# Patient Record
Sex: Female | Born: 1975
Health system: Southern US, Community
[De-identification: ages and names within clinical notes are randomized; demographics above are authoritative.]

## PROBLEM LIST (undated history)

## (undated) DIAGNOSIS — F419 Anxiety disorder, unspecified: Secondary | ICD-10-CM

## (undated) DIAGNOSIS — D649 Anemia, unspecified: Secondary | ICD-10-CM

## (undated) DIAGNOSIS — I10 Essential (primary) hypertension: Secondary | ICD-10-CM

## (undated) HISTORY — DX: Essential (primary) hypertension: I10

---

## 2006-06-23 ENCOUNTER — Ambulatory Visit: Payer: Self-pay | Admitting: Obstetrics & Gynecology

## 2007-06-26 ENCOUNTER — Encounter (INDEPENDENT_AMBULATORY_CARE_PROVIDER_SITE_OTHER): Payer: Self-pay | Admitting: Gynecology

## 2007-06-26 ENCOUNTER — Ambulatory Visit: Payer: Self-pay | Admitting: Gynecology

## 2007-10-25 ENCOUNTER — Ambulatory Visit: Payer: Self-pay | Admitting: Internal Medicine

## 2008-05-02 ENCOUNTER — Ambulatory Visit: Payer: Self-pay | Admitting: Internal Medicine

## 2008-07-03 ENCOUNTER — Encounter (INDEPENDENT_AMBULATORY_CARE_PROVIDER_SITE_OTHER): Payer: Self-pay | Admitting: Gynecology

## 2008-07-03 ENCOUNTER — Ambulatory Visit: Payer: Self-pay | Admitting: Gynecology

## 2011-04-20 NOTE — Assessment & Plan Note (Signed)
Brooke Jones, Brooke Jones                  ACCOUNT NO.:  [id number]   MEDICAL RECORD NO.:  [id number]          PATIENT TYPE:  POB   LOCATION:  [facility] at [facility]         FACILITY:  [facility]   PHYSICIAN:  Ginger Carne, MD DATE OF BIRTH:  1976-02-03   DATE OF SERVICE:  07/03/2008                                  CLINIC NOTE   Brooke Jones returns today for routine gynecological evaluation.  She is a  G1, P 1-0-0-1 who takes the generic form of Yasmin and desires routine  gynecologic exam today.  She has stress related to her job and has  difficulty sleeping, has asked for mild sleeping medication.  Otherwise,  she has no specific GI, GU, cardiac, or GYN complaints.  She had one  episode of postcoital bleeding, but this has not recurred.   PHYSICAL EXAMINATION:  VITALS SIGNS:  Blood pressure 128/95, weight 199  pounds, height 5 feet 5 inches, pulse 87 and regular.  HEENT:  Grossly normal.  BREAST:  Without masses, discharge, thickenings, or tenderness.  CHEST:  Clear to percussion and auscultation.  CARDIOVASCULAR:  Without murmurs or enlargements.  Regular rate and  rhythm.  EXTREMITY:  Within normal limits.  LYMPHATIC:  Within normal limits.  SKIN:  Within normal limits.  NEUROLOGICAL:  Within normal limits.  MUSCULOSKELETAL:  Within normal limits.  ABDOMEN:  Soft without gross hepatosplenomegaly.  PELVIC:  External genitalia, vulva and vagina normal.  Cervix smooth  without erosions or lesions.  Pap performed.  Uterus small, anteverted,  and flexed.  Both adnexa palpable and found to be normal.   IMPRESSION:  Normal gynecologic exam.   PLAN:  The patient was prescribed Yasmin for 1 year.  Also, given Ambien  5 mg to take 1 at night.  She was told if this does not help with sleep,  she could try 2 at night and let us know how she is doing.  I asked her  to keep track of her postcoital bleeding.           ______________________________  Ginger Carne, MD     SHB/MEDQ  D:   07/03/2008  T:  07/03/2008  Job:  [id number]

## 2011-04-20 NOTE — Assessment & Plan Note (Signed)
NAMESHERRELLE, PROCHAZKA NO.:  [id number]   MEDICAL RECORD NO.:  [id number]          PATIENT TYPE:  POB   LOCATION:  [facility]         FACILITY:  [facility]   PHYSICIAN:  Ginger Carne, MD DATE OF BIRTH:  [birth year]   DATE OF SERVICE:                                  CLINIC NOTE   ADDENDUM   This is an addendum note to physical examination number [phone],  dictation on July 03, 2008.  The patient's blood pressure was 128/95 at  this visit.  Past evaluations revealed 129/97 in July 2008.  The patient  was asked to return in 3-4 weeks if it is still elevated.  The patient  will be referred to primary care physician for management of her blood  pressure.           ______________________________  Ginger Carne, MD     SHB/MEDQ  D:  07/03/2008  T:  07/03/2008  Job:  [id number]

## 2011-04-23 NOTE — Group Therapy Note (Signed)
NAMEZOHAR, LAING NO.:  [id number]   MEDICAL RECORD NO.:  [id number]          PATIENT TYPE:  POB   LOCATION:  [facility]                   FACILITY:  [facility]   PHYSICIAN:  Elsie Lincoln, MD      DATE OF BIRTH:  November 08, 1976   DATE OF SERVICE:                                    CLINIC NOTE   OFFICE VISIT [facility]   DATE OF VISIT:  June 23, 2006   SUBJECTIVE:  This is a 35 year old para 1 female with last menstrual period  June 14, 2006 who presents for her yearly physical.  She is divorced and  sexually active with no complaints.  She is on Ovcon 50 and would like to  switch to a lower estrogen pill.  She was on Triphasil many years ago but  her ex-husband thought that she was moody and they switched it to a higher  estrogen pill.  However, now with the increased risk of DVT with a high  estrogen pill and she is no longer married to the man who thought she was  moody, going back to a 30 mcg pill would be more suitable.  We will try  Yasmin as this will also decrease her risk of DVT.  The patient had one  irregular cycle last month while on antibiotics.  She used a back-up method  during this time frame.  She has no other complaints.  She is sexually  active with no dyspareunia.  There is no leakage of urine.   PAST MEDICAL HISTORY:  She denies all medical problems.   PAST SURGICAL HISTORY:  She denies all surgeries.   GYN HISTORY:  An SVD x1.  She has a 34-year-old child.  No abnormal Pap  smears.  No GYN problems.   ALLERGIES:  PENICILLIN.   MEDICATIONS:  Ovcon which has now been stopped.   FAMILY HISTORY:  Father has had a heart attack at age 47, borderline  diabetes.  No ovarian cancer, breast cancer or colon cancer.   REVIEW OF SYSTEMS:  Negative.   PHYSICAL EXAMINATION:  VITAL SIGNS:  Pulse 98.  Blood pressure 139/92.  Weight 201.  Height 5 feet 5 inches.  GENERAL APPEARANCE:  Well-nourished, well-developed, in no apparent  distress.  HEENT:  Normocephalic, atraumatic.  Good dentition.  NECK:  Thyroid with no masses.  LUNGS:  Clear to auscultation bilaterally.  HEART:  Regular rate and rhythm with a mild systolic ejection murmur at the  left sternal border.  ABDOMEN:  Soft, nontender, slightly obese.  No hernias.  BREASTS:  No masses, nontender, no lymphadenopathy.  No skin changes.  PELVIC:  Genitalia Tanner V.  Urethra:  No prolapse, nontender. No  cystocele.  Vagina pink, normal rugae.  Cervix: No prolapse, nontender.  Uterus nontender.  Adnexa with no masses, nontender.  No rectocele.  Perineum intact.  EXTREMITIES:  Nontender, no edema.   ASSESSMENT:  Patient is a 35 year old well female.   PLAN:  1.  Switch from Ovcon 50 to Yasmin.  2.  Fasting lipids and basic metabolic panel.  3.  Return to  clinic in one year.           ______________________________  Elsie Lincoln, MD     KL/MEDQ  D:  06/23/2006  T:  06/23/2006  Job:  [id number]

## 2012-11-12 ENCOUNTER — Ambulatory Visit: Payer: Self-pay | Admitting: Family Medicine

## 2012-11-12 LAB — CBC WITH DIFFERENTIAL/PLATELET
Basophil %: 1.2 %
Eosinophil %: 1.1 %
HCT: 36.3 % (ref 35.0–47.0)
HGB: 11.8 g/dL — ABNORMAL LOW (ref 12.0–16.0)
Lymphocyte %: 26.5 %
MCH: 24.4 pg — ABNORMAL LOW (ref 26.0–34.0)
MCV: 75 fL — ABNORMAL LOW (ref 80–100)
Monocyte %: 4 %
Neutrophil %: 67.2 %
Platelet: 290 10*3/uL (ref 150–440)

## 2012-11-12 LAB — COMPREHENSIVE METABOLIC PANEL
Albumin: 3.5 g/dL (ref 3.4–5.0)
Alkaline Phosphatase: 57 U/L (ref 50–136)
BUN: 10 mg/dL (ref 7–18)
Bilirubin,Total: 0.4 mg/dL (ref 0.2–1.0)
Chloride: 102 mmol/L (ref 98–107)
Creatinine: 0.66 mg/dL (ref 0.60–1.30)
Glucose: 92 mg/dL (ref 65–99)
SGOT(AST): 15 U/L (ref 15–37)
SGPT (ALT): 17 U/L (ref 12–78)
Total Protein: 8 g/dL (ref 6.4–8.2)

## 2012-11-12 LAB — CK: CK, Total: 55 U/L (ref 21–215)

## 2012-11-12 LAB — CK-MB: CK-MB: <0.5 ng/mL — ABNORMAL LOW (ref 0.5–3.6)

## 2013-09-20 ENCOUNTER — Ambulatory Visit: Payer: Self-pay | Admitting: Emergency Medicine

## 2013-12-11 IMAGING — CR DG CHEST 2V
1 series · 3 of 3 positions shown · non-contrast
Comparison: none

REASON FOR EXAM: chest pain
COMMENTS:

PROCEDURE:     MDR - MDR CHEST PA(OR AP) AND LATERAL  - November 12, 2012 [DATE]
RESULT:     Comparison: None.

[Series 1: pa · 0.17mm/px · 3 of 3 slices shown]
[im 1/3]
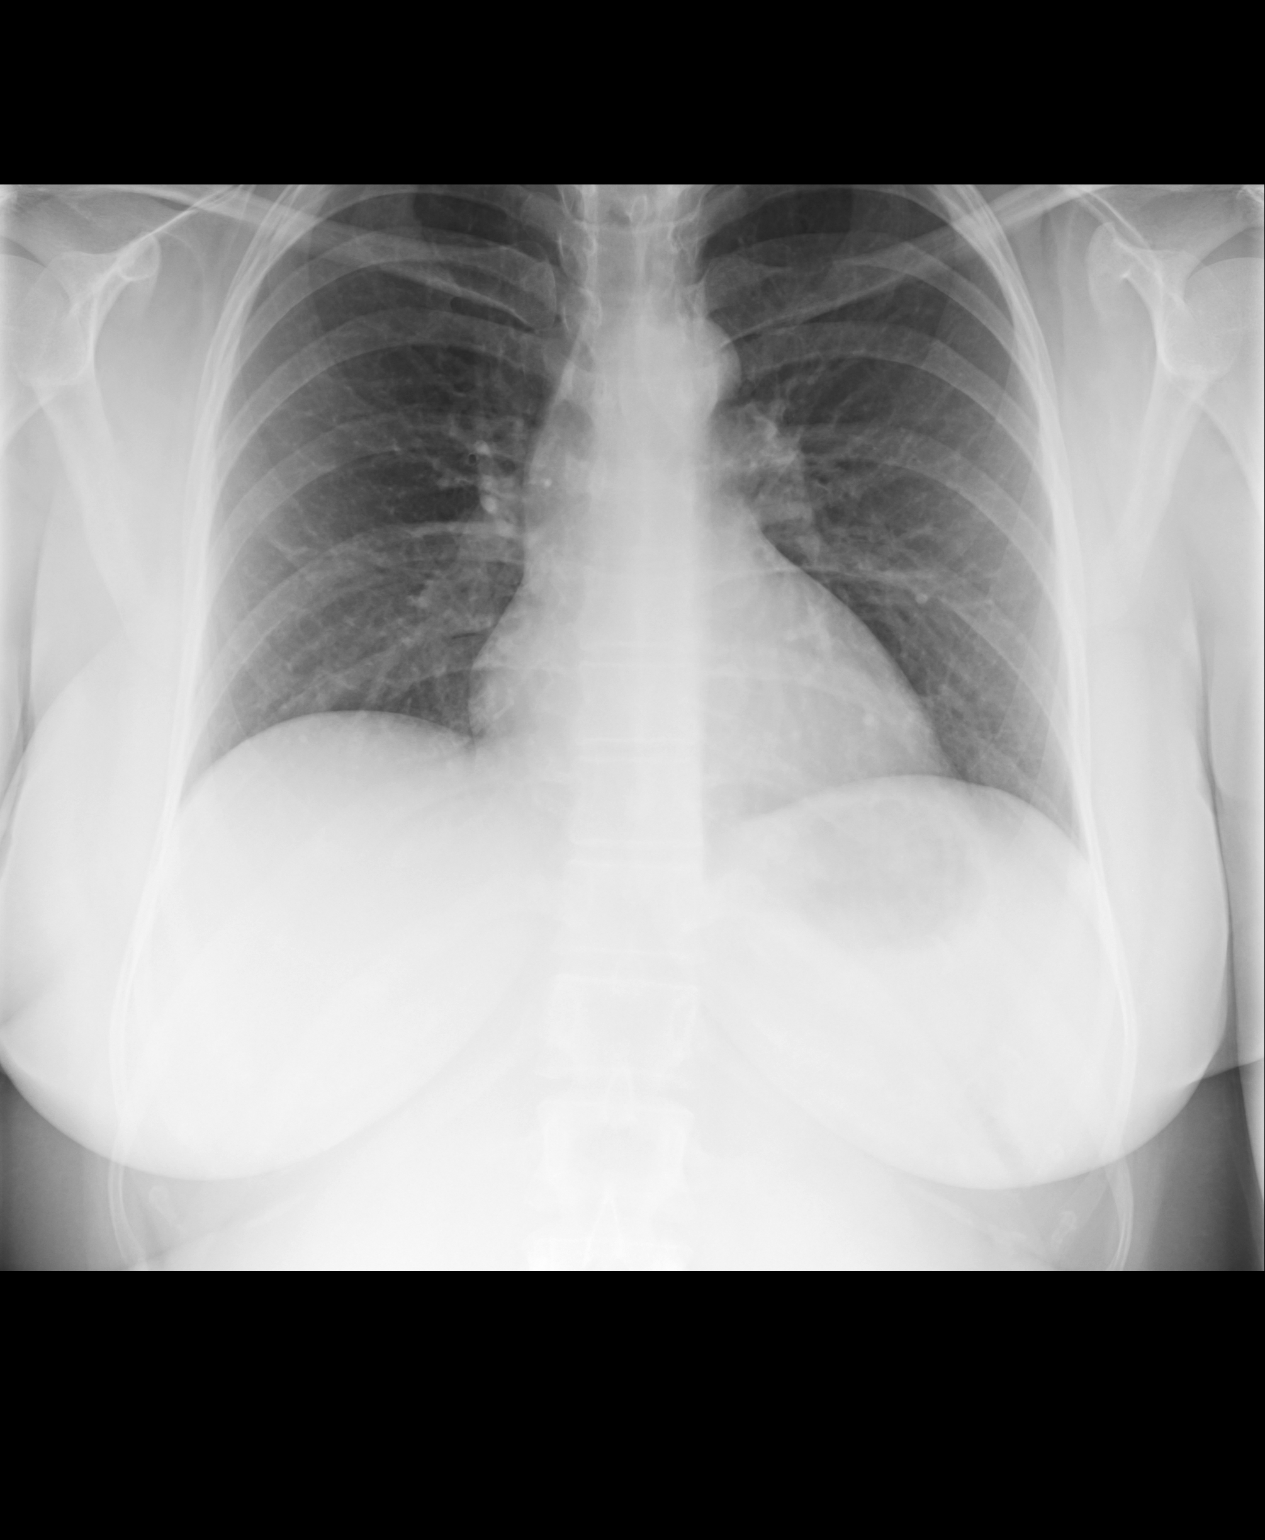
[im 2/3]
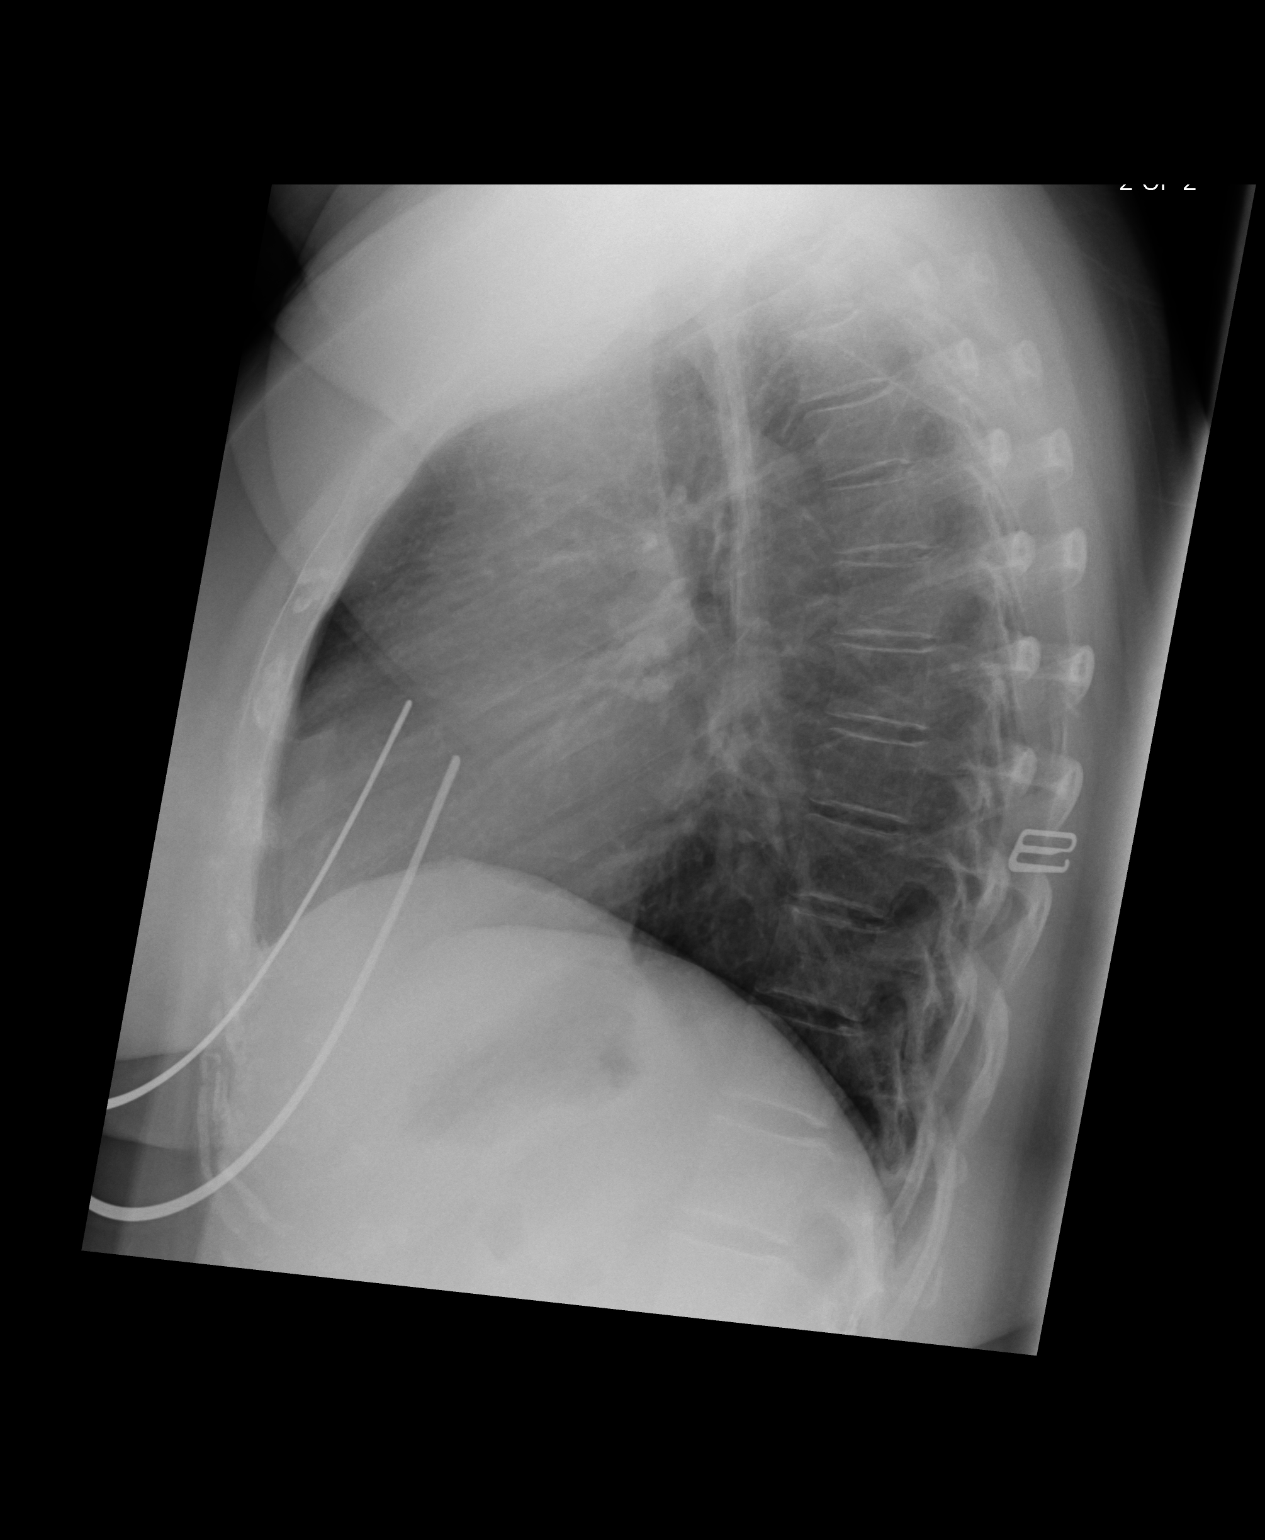
[im 3/3]
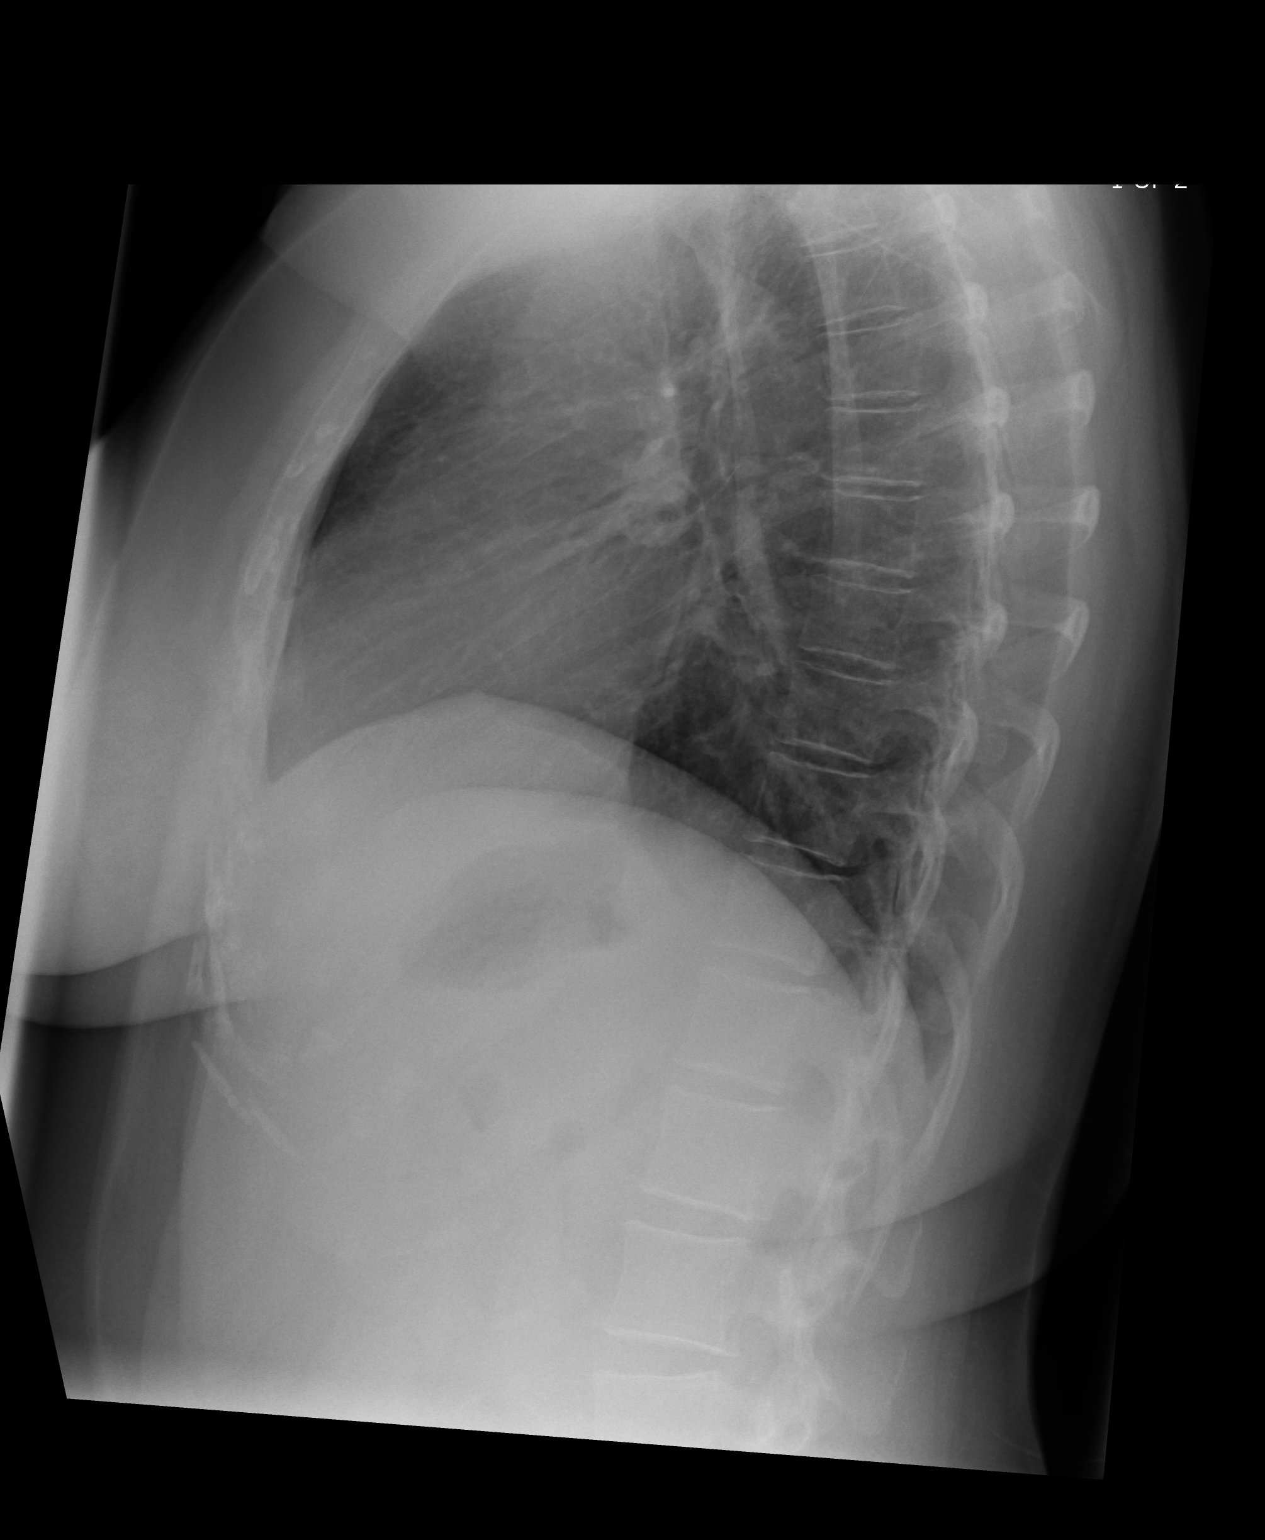

[3 of 3 positions shown; findings below may reference images not displayed]

FINDINGS: The heart and mediastinum are within normal limits. The lung volumes are
slightly diminished. Minimal linear opacities in the right and left mid
lungs are likely secondary to subsegmental atelectasis.
IMPRESSION: Minimal linear opacities in the right and left mid lungs are likely
secondary to subsegmental atelectasis.

[REDACTED]

## 2017-02-07 ENCOUNTER — Telehealth: Payer: Self-pay

## 2017-02-07 NOTE — Telephone Encounter (Signed)
Pt called stating her refill of bc is diff. Name brand.  Which is she supposed to have? I called pt.  She said the order is for ortho tri-cyclen but she received tri-sprintec. Adv generic equivalent.

## 2018-01-31 ENCOUNTER — Other Ambulatory Visit: Payer: Self-pay | Admitting: Obstetrics & Gynecology

## 2018-02-01 ENCOUNTER — Telehealth: Payer: Self-pay | Admitting: Obstetrics & Gynecology

## 2018-02-01 NOTE — Telephone Encounter (Signed)
Called and left voice mail for patient to call back to be schedule °

## 2018-02-01 NOTE — Telephone Encounter (Signed)
-----   Message from Nadara Mustardobert P Harris, MD sent at 01/31/2018 11:29 AM EST ----- Regarding: Sch ANNUAL Schedule annual appt.  Refill for OCP done til then.

## 2018-02-01 NOTE — Telephone Encounter (Signed)
Patient schedule 02/13/18 for annual

## 2018-02-13 ENCOUNTER — Ambulatory Visit: Payer: Self-pay | Admitting: Obstetrics & Gynecology

## 2018-03-01 ENCOUNTER — Other Ambulatory Visit: Payer: Self-pay | Admitting: Obstetrics & Gynecology

## 2018-03-06 ENCOUNTER — Ambulatory Visit (INDEPENDENT_AMBULATORY_CARE_PROVIDER_SITE_OTHER): Payer: BLUE CROSS/BLUE SHIELD | Admitting: Obstetrics & Gynecology

## 2018-03-06 ENCOUNTER — Encounter: Payer: Self-pay | Admitting: Obstetrics & Gynecology

## 2018-03-06 VITALS — BP 130/90 | HR 108 | Ht 65.0 in | Wt 215.0 lb

## 2018-03-06 DIAGNOSIS — Z1239 Encounter for other screening for malignant neoplasm of breast: Secondary | ICD-10-CM

## 2018-03-06 DIAGNOSIS — Z0000 Encounter for general adult medical examination without abnormal findings: Secondary | ICD-10-CM | POA: Diagnosis not present

## 2018-03-06 DIAGNOSIS — Z1231 Encounter for screening mammogram for malignant neoplasm of breast: Secondary | ICD-10-CM

## 2018-03-06 MED ORDER — NORGESTIM-ETH ESTRAD TRIPHASIC 0.18/0.215/0.25 MG-35 MCG PO TABS: 1.0000 | ORAL_TABLET | Freq: Every day | ORAL | 11 refills | Status: DC

## 2018-03-06 NOTE — Progress Notes (Signed)
HPI:      Brooke Jones is a 42 y.o. G1P1001 who LMP was Patient's last menstrual period was 02/05/2018.,she presents today for her annual examination. The patient has no complaints today.  Pt takes meds for her cHTN and has no sx's related to this. The patient is sexually active. Her last pap: approximate date 2017 and was normal and last mammogram: approximate date 2016 and was normal. The patient does perform self breast exams.  There is no notable family history of breast or ovarian cancer in her family.  The patient has regular exercise: yes.  The patient denies current symptoms of depression.    GYN History: Contraception: OCP (estrogen/progesterone)  PMHx: Past Medical History:  Diagnosis Date  . Hypertension    History reviewed. No pertinent surgical history. Family History  Problem Relation Age of Onset  . Heart disease Father   . Hypertension Father   . Esophageal cancer Father    Social History   Tobacco Use  . Smoking status: Never Smoker  . Smokeless tobacco: Never Used  Substance Use Topics  . Alcohol use: Not Currently  . Drug use: Not Currently    Current Outpatient Medications:  .  Norgestimate-Ethinyl Estradiol Triphasic (TRI-SPRINTEC) 0.18/0.215/0.25 MG-35 MCG tablet, Take 1 tablet by mouth daily., Disp: 28 tablet, Rfl: 11 Allergies: Penicillins and Lisinopril  Review of Systems  Constitutional: Negative for chills, fever and malaise/fatigue.  HENT: Negative for congestion, sinus pain and sore throat.   Eyes: Negative for blurred vision and pain.  Respiratory: Negative for cough and wheezing.   Cardiovascular: Negative for chest pain and leg swelling.  Gastrointestinal: Negative for abdominal pain, constipation, diarrhea, heartburn, nausea and vomiting.  Genitourinary: Negative for dysuria, frequency, hematuria and urgency.  Musculoskeletal: Negative for back pain, joint pain, myalgias and neck pain.  Skin: Negative for itching and rash.    Neurological: Negative for dizziness, tremors and weakness.  Endo/Heme/Allergies: Does not bruise/bleed easily.  Psychiatric/Behavioral: Negative for depression. The patient is not nervous/anxious and does not have insomnia.     Objective: BP 130/90   Pulse (!) 108   Ht 5\' 5"  (1.651 m)   Wt 215 lb (97.5 kg)   LMP 02/05/2018   BMI 35.78 kg/m   Filed Weights   03/06/18 1428  Weight: 215 lb (97.5 kg)   Body mass index is 35.78 kg/m. Physical Exam  Constitutional: She is oriented to person, place, and time. She appears well-developed and well-nourished. No distress.  Genitourinary: Rectum normal, vagina normal and uterus normal. Pelvic exam was performed with patient supine. There is no rash or lesion on the right labia. There is no rash or lesion on the left labia. Vagina exhibits no lesion. No bleeding in the vagina. Right adnexum does not display mass and does not display tenderness. Left adnexum does not display mass and does not display tenderness. Cervix does not exhibit motion tenderness, lesion, friability or polyp.   Uterus is mobile and midaxial. Uterus is not enlarged or exhibiting a mass.  HENT:  Head: Normocephalic and atraumatic. Head is without laceration.  Right Ear: Hearing normal.  Left Ear: Hearing normal.  Nose: No epistaxis.  No foreign bodies.  Mouth/Throat: Uvula is midline, oropharynx is clear and moist and mucous membranes are normal.  Eyes: Pupils are equal, round, and reactive to light.  Neck: Normal range of motion. Neck supple. No thyromegaly present.  Cardiovascular: Normal rate and regular rhythm. Exam reveals no gallop and no friction rub.  No  murmur heard. Pulmonary/Chest: Effort normal and breath sounds normal. No respiratory distress. She has no wheezes. Right breast exhibits no mass, no skin change and no tenderness. Left breast exhibits no mass, no skin change and no tenderness.  Abdominal: Soft. Bowel sounds are normal. She exhibits no distension.  There is no tenderness. There is no rebound.  Musculoskeletal: Normal range of motion.  Neurological: She is alert and oriented to person, place, and time. No cranial nerve deficit.  Skin: Skin is warm and dry.  Psychiatric: She has a normal mood and affect. Judgment normal.  Vitals reviewed.  Assessment:  ANNUAL EXAM 1. Annual physical exam   2. Screening for breast cancer    Screening Plan:            1.  Cervical Screening-  Pap smear schedule reviewed with patient, due 2020  2. Breast screening- Exam annually and mammogram>40 planned   3. Labs managed by PCP  4. Counseling for contraception: oral contraceptives (estrogen/progesterone).  Monitor BPs and cont w Metropolol meds for HTN.  Risks of OCP and HTN discussed.  Alternative BC also counseled.    F/U  Return in about 1 year (around 03/07/2019) for Annual.  Annamarie Major, MD, [facility], [facility] 03/06/2018  3:00 PM

## 2018-03-06 NOTE — Patient Instructions (Signed)
PAP every three years Mammogram every year    Call [phone] to schedule at [facility] yearly (with PCP)   

## 2018-04-19 ENCOUNTER — Ambulatory Visit
Admission: RE | Admit: 2018-04-19 | Discharge: 2018-04-19 | Disposition: A | Discharge status: Home / Self Care | Payer: BLUE CROSS/BLUE SHIELD | Source: Ambulatory Visit | Attending: Obstetrics & Gynecology | Admitting: Obstetrics & Gynecology

## 2018-04-19 ENCOUNTER — Encounter (INDEPENDENT_AMBULATORY_CARE_PROVIDER_SITE_OTHER): Payer: Self-pay

## 2018-04-19 ENCOUNTER — Encounter: Payer: Self-pay | Admitting: Radiology

## 2018-04-19 DIAGNOSIS — Z1231 Encounter for screening mammogram for malignant neoplasm of breast: Secondary | ICD-10-CM | POA: Insufficient documentation

## 2018-04-19 DIAGNOSIS — Z1239 Encounter for other screening for malignant neoplasm of breast: Secondary | ICD-10-CM

## 2018-04-21 ENCOUNTER — Inpatient Hospital Stay
Admission: RE | Admit: 2018-04-21 | Discharge: 2018-04-21 | Disposition: A | Discharge status: Home / Self Care | Payer: Self-pay | Source: Ambulatory Visit | Attending: *Deleted | Admitting: *Deleted

## 2018-04-21 ENCOUNTER — Encounter: Payer: Self-pay | Admitting: Obstetrics & Gynecology

## 2018-04-21 ENCOUNTER — Other Ambulatory Visit: Payer: Self-pay | Admitting: *Deleted

## 2018-04-21 DIAGNOSIS — Z9289 Personal history of other medical treatment: Secondary | ICD-10-CM

## 2019-02-21 ENCOUNTER — Other Ambulatory Visit: Payer: Self-pay | Admitting: Obstetrics & Gynecology

## 2019-05-07 DIAGNOSIS — E669 Obesity, unspecified: Secondary | ICD-10-CM | POA: Diagnosis not present

## 2019-05-07 DIAGNOSIS — I10 Essential (primary) hypertension: Secondary | ICD-10-CM | POA: Diagnosis not present

## 2019-05-07 DIAGNOSIS — D508 Other iron deficiency anemias: Secondary | ICD-10-CM | POA: Diagnosis not present

## 2019-05-07 DIAGNOSIS — E781 Pure hyperglyceridemia: Secondary | ICD-10-CM | POA: Diagnosis not present

## 2019-05-07 DIAGNOSIS — D72829 Elevated white blood cell count, unspecified: Secondary | ICD-10-CM | POA: Diagnosis not present

## 2019-05-07 DIAGNOSIS — R718 Other abnormality of red blood cells: Secondary | ICD-10-CM | POA: Diagnosis not present

## 2019-05-18 IMAGING — MG MM DIGITAL SCREENING BILAT W/ CAD
4 series · 4 of 4 positions shown · non-contrast
Comparison: Previous exam(s).

CLINICAL DATA: Screening.

EXAM:
DIGITAL SCREENING BILATERAL MAMMOGRAM WITH CAD

[R MLO]
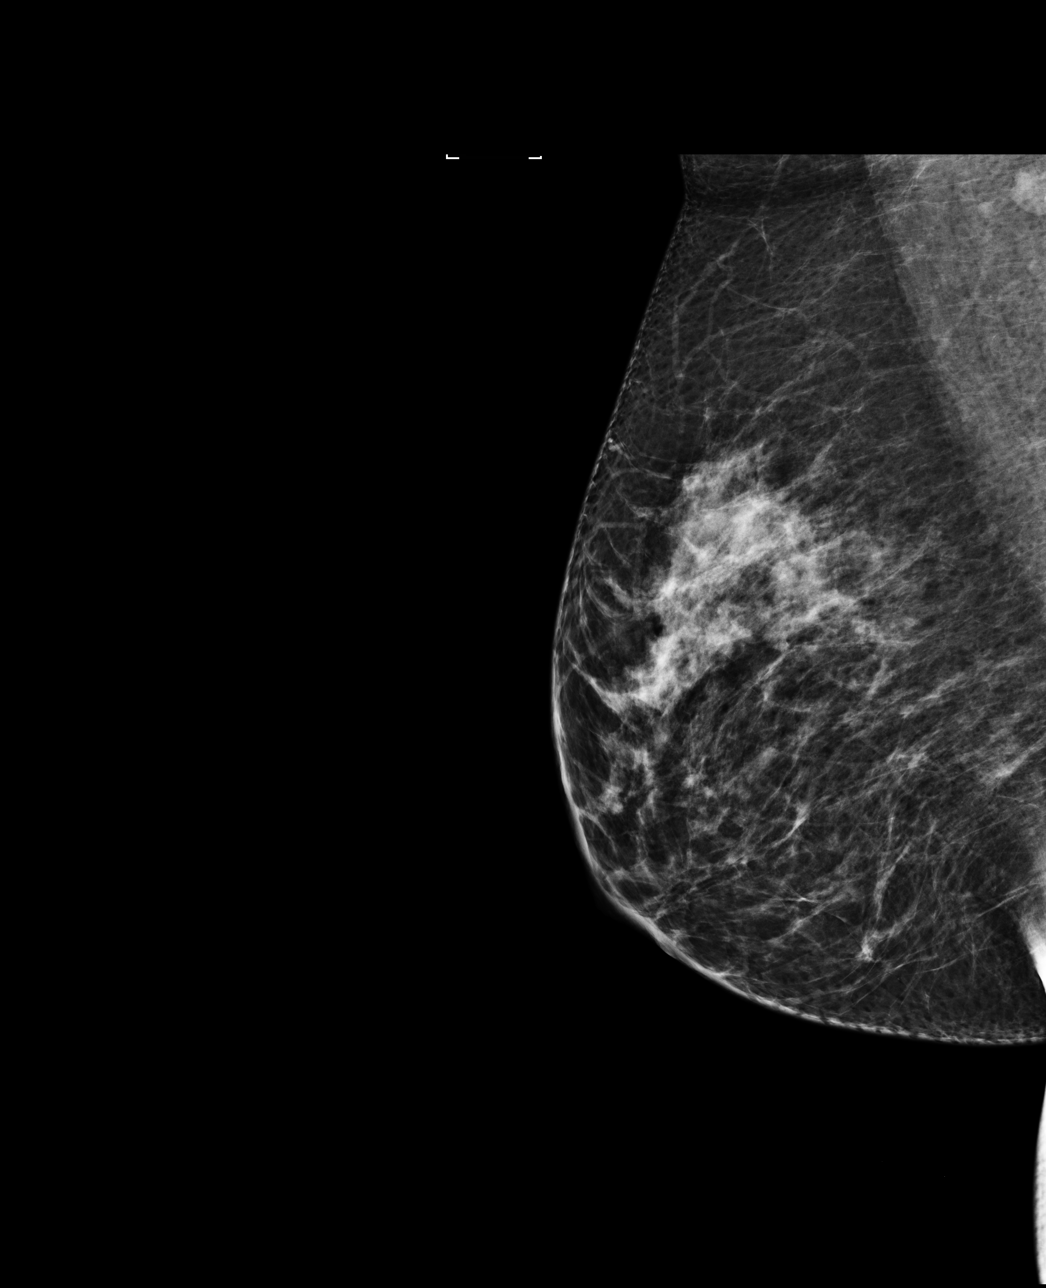

[L MLO]
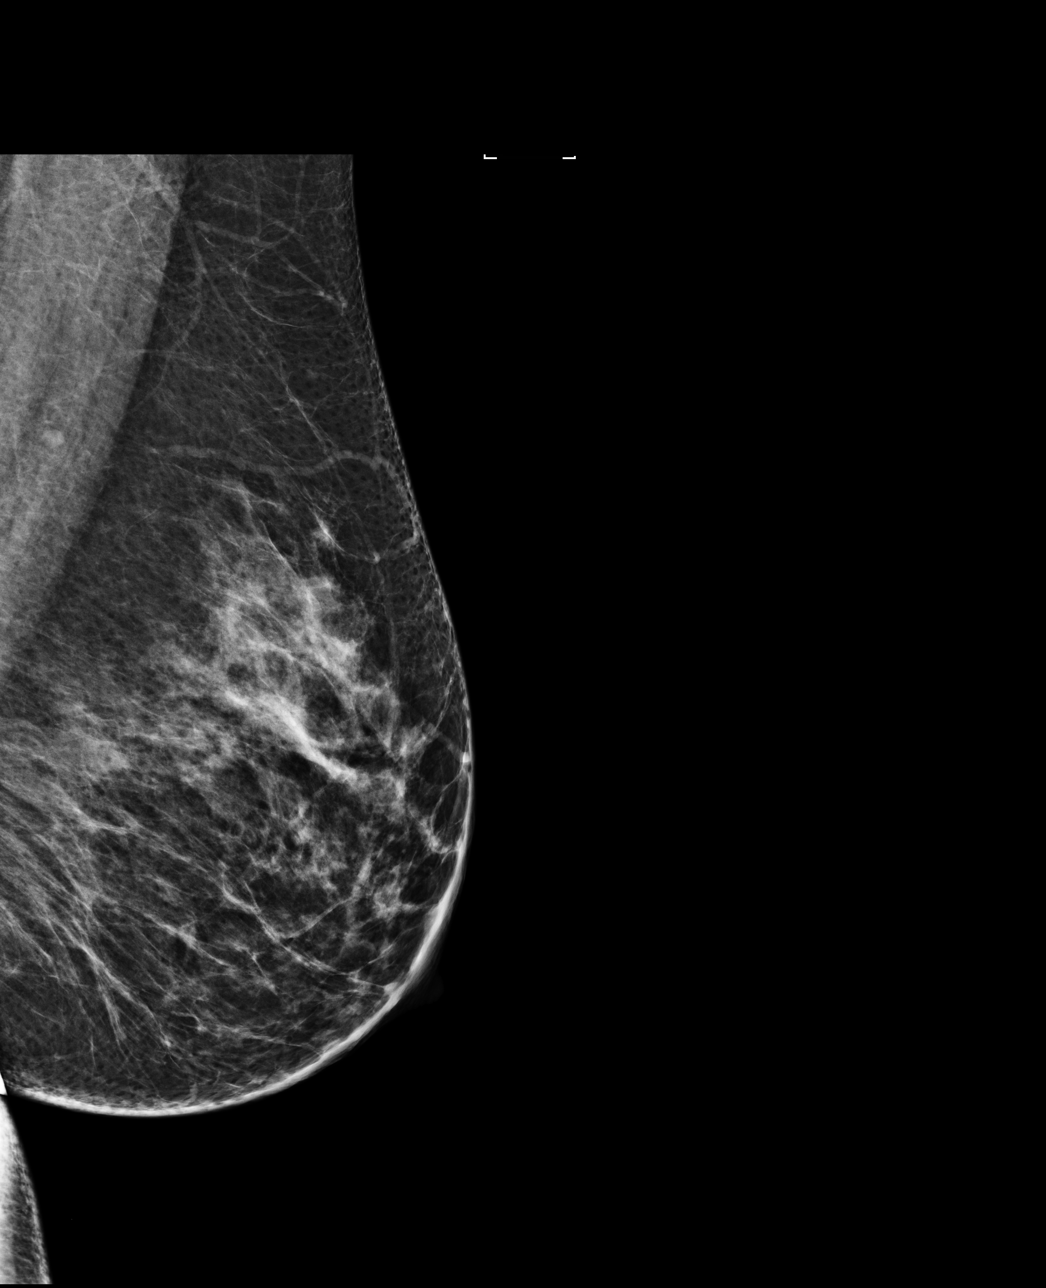

[L CC]
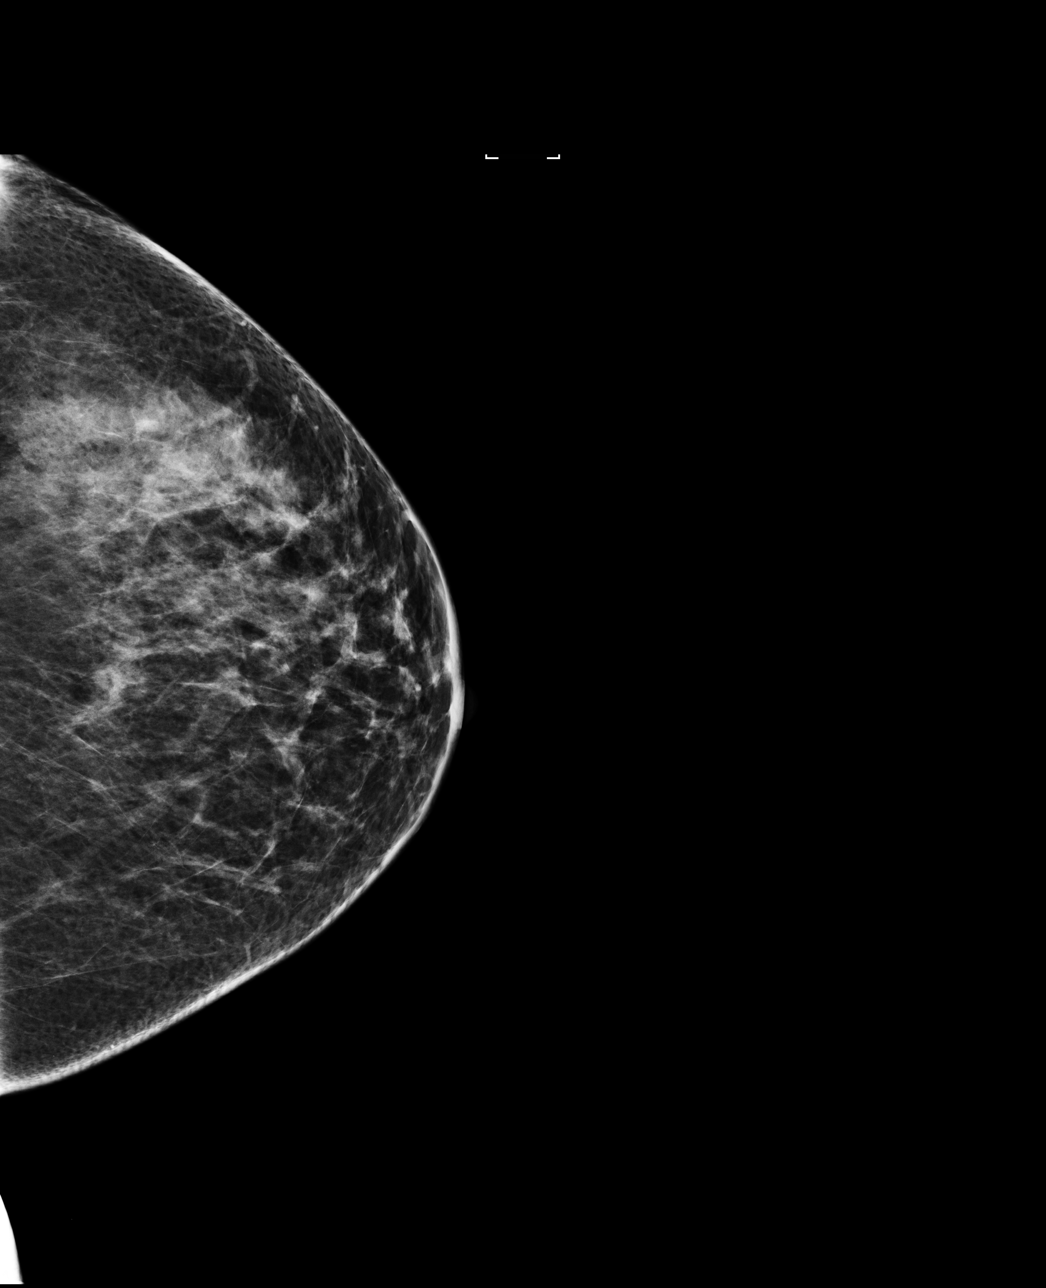

[R CC]
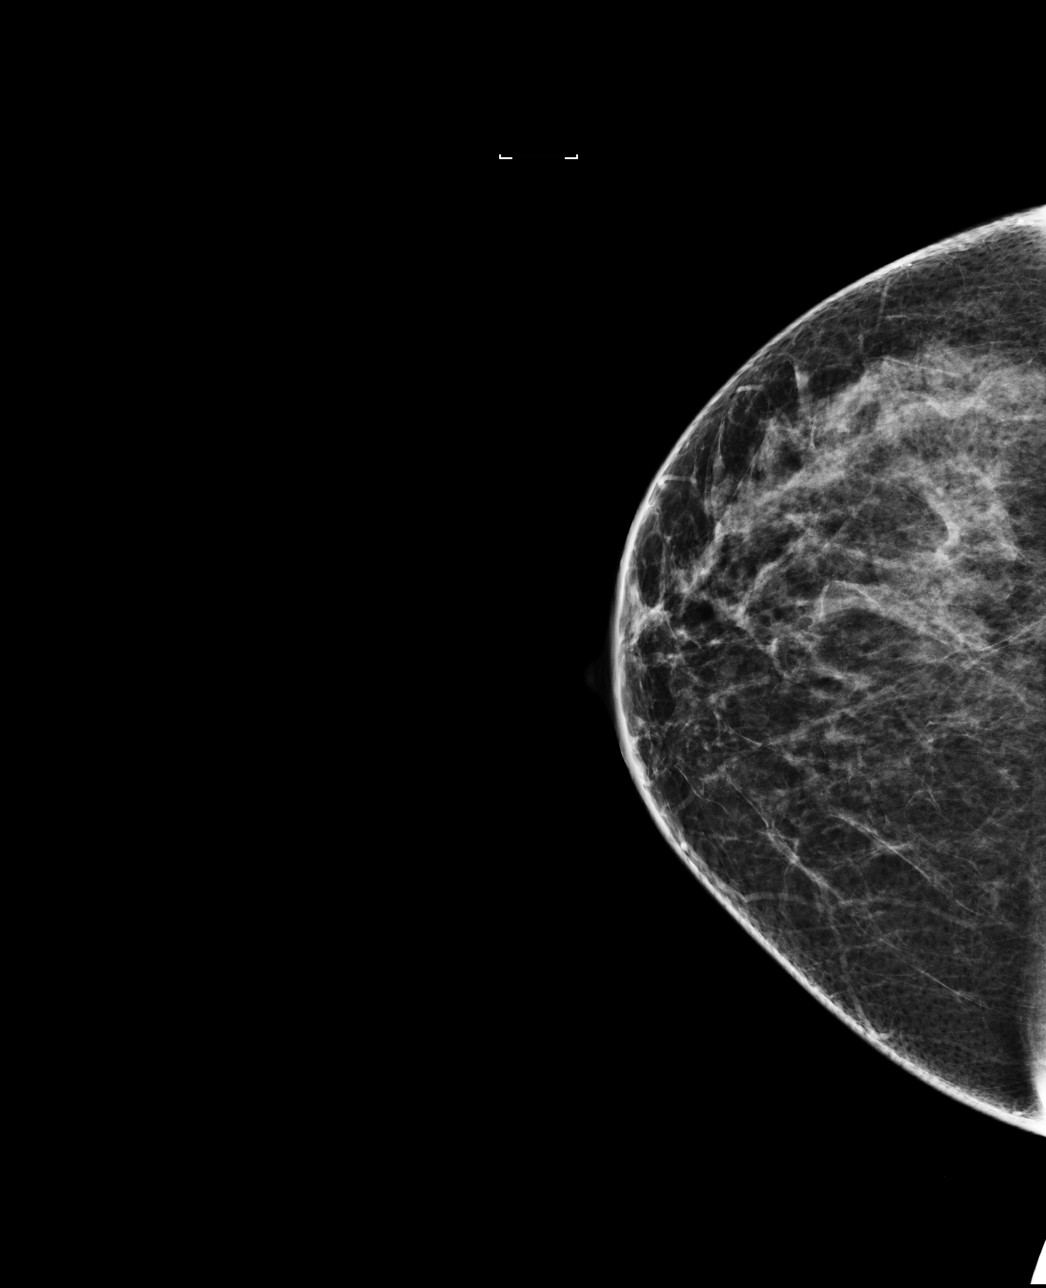

[4 of 4 positions shown; findings below may reference images not displayed]

ACR Breast Density Category c: The breast tissue is heterogeneously
dense, which may obscure small masses.
FINDINGS: There are no findings suspicious for malignancy. Images were
processed with CAD.
IMPRESSION: No mammographic evidence of malignancy. A result letter of this
screening mammogram will be mailed directly to the patient.

RECOMMENDATION:
Screening mammogram in one year. (Code:YJ-[id number])

BI-RADS CATEGORY  1: Negative.

## 2019-05-21 DIAGNOSIS — Z1159 Encounter for screening for other viral diseases: Secondary | ICD-10-CM | POA: Diagnosis not present

## 2019-05-25 ENCOUNTER — Telehealth: Payer: Self-pay | Admitting: Obstetrics & Gynecology

## 2019-05-25 NOTE — Telephone Encounter (Signed)
Patient needs refill on bc to get her to her annual visit with [facility] 7/31.  [place].

## 2019-05-28 ENCOUNTER — Other Ambulatory Visit: Payer: Self-pay

## 2019-05-28 MED ORDER — NORGESTIM-ETH ESTRAD TRIPHASIC 0.18/0.215/0.25 MG-35 MCG PO TABS: 1.0000 | ORAL_TABLET | Freq: Every day | ORAL | 0 refills | Status: DC

## 2019-05-28 NOTE — Telephone Encounter (Signed)
Refilled BC called pt to let her know, no answer, no voice mailbox

## 2019-05-30 DIAGNOSIS — D508 Other iron deficiency anemias: Secondary | ICD-10-CM | POA: Diagnosis not present

## 2019-05-30 DIAGNOSIS — I10 Essential (primary) hypertension: Secondary | ICD-10-CM | POA: Diagnosis not present

## 2019-06-20 DIAGNOSIS — D72829 Elevated white blood cell count, unspecified: Secondary | ICD-10-CM | POA: Diagnosis not present

## 2019-06-20 DIAGNOSIS — D509 Iron deficiency anemia, unspecified: Secondary | ICD-10-CM | POA: Diagnosis not present

## 2019-06-21 ENCOUNTER — Other Ambulatory Visit: Payer: Self-pay | Admitting: Obstetrics & Gynecology

## 2019-07-06 ENCOUNTER — Ambulatory Visit: Payer: BLUE CROSS/BLUE SHIELD | Admitting: Obstetrics & Gynecology

## 2019-07-20 ENCOUNTER — Encounter: Payer: Self-pay | Admitting: Obstetrics & Gynecology

## 2019-07-20 ENCOUNTER — Other Ambulatory Visit (HOSPITAL_COMMUNITY)
Admission: RE | Admit: 2019-07-20 | Discharge: 2019-07-20 | Disposition: A | Discharge status: Home / Self Care | Payer: BC Managed Care – PPO | Source: Ambulatory Visit | Attending: Obstetrics & Gynecology | Admitting: Obstetrics & Gynecology

## 2019-07-20 ENCOUNTER — Ambulatory Visit (INDEPENDENT_AMBULATORY_CARE_PROVIDER_SITE_OTHER): Payer: BC Managed Care – PPO | Admitting: Obstetrics & Gynecology

## 2019-07-20 ENCOUNTER — Other Ambulatory Visit: Payer: Self-pay

## 2019-07-20 VITALS — BP 140/98 | Ht 65.0 in | Wt 223.0 lb

## 2019-07-20 DIAGNOSIS — Z01419 Encounter for gynecological examination (general) (routine) without abnormal findings: Secondary | ICD-10-CM

## 2019-07-20 DIAGNOSIS — Z1239 Encounter for other screening for malignant neoplasm of breast: Secondary | ICD-10-CM

## 2019-07-20 DIAGNOSIS — Z124 Encounter for screening for malignant neoplasm of cervix: Secondary | ICD-10-CM

## 2019-07-20 DIAGNOSIS — Z3041 Encounter for surveillance of contraceptive pills: Secondary | ICD-10-CM

## 2019-07-20 MED ORDER — NORGESTIM-ETH ESTRAD TRIPHASIC 0.18/0.215/0.25 MG-35 MCG PO TABS: 1.0000 | ORAL_TABLET | Freq: Every day | ORAL | 3 refills | Status: DC

## 2019-07-20 NOTE — Progress Notes (Signed)
HPI:      Ms. Brooke HamsLori Jones is a 43 y.o. G1P1001 who LMP was Patient's last menstrual period was 06/24/2019., she presents today for her annual examination. The patient has no complaints today.  Denies HA, CP, SOB.  Has normal BP usually at home and other places.  OCP helps w period control and birth control and desires to continue.  Occas hot flash reported. The patient is sexually active. Her last pap: approximate date 2017 and was normal and last mammogram: approximate date 2019 and was normal. The patient does perform self breast exams.  There is no notable family history of breast or ovarian cancer in her family.  The patient has regular exercise: yes.  The patient denies current symptoms of depression.    GYN History: Contraception: OCP (estrogen/progesterone)  PMHx: Past Medical History:  Diagnosis Date  . Hypertension    History reviewed. No pertinent surgical history. Family History  Problem Relation Age of Onset  . Heart disease Father   . Hypertension Father   . Esophageal cancer Father   . Breast cancer Neg Hx    Social History   Tobacco Use  . Smoking status: Never Smoker  . Smokeless tobacco: Never Used  Substance Use Topics  . Alcohol use: Not Currently  . Drug use: Not Currently    Current Outpatient Medications:  .  Norgestimate-Ethinyl Estradiol Triphasic 0.18/0.215/0.25 MG-35 MCG tablet, Take 1 tablet by mouth daily., Disp: 84 tablet, Rfl: 3 Allergies: Penicillins and Lisinopril  Review of Systems  Constitutional: Negative for chills, fever and malaise/fatigue.  HENT: Negative for congestion, sinus pain and sore throat.   Eyes: Negative for blurred vision and pain.  Respiratory: Negative for cough and wheezing.   Cardiovascular: Negative for chest pain and leg swelling.  Gastrointestinal: Negative for abdominal pain, constipation, diarrhea, heartburn, nausea and vomiting.  Genitourinary: Negative for dysuria, frequency, hematuria and urgency.   Musculoskeletal: Negative for back pain, joint pain, myalgias and neck pain.  Skin: Negative for itching and rash.  Neurological: Negative for dizziness, tremors and weakness.  Endo/Heme/Allergies: Does not bruise/bleed easily.  Psychiatric/Behavioral: Negative for depression. The patient is not nervous/anxious and does not have insomnia.     Objective: BP (!) 140/98   Ht 5\' 5"  (1.651 m)   Wt 223 lb (101.2 kg)   LMP 06/24/2019   BMI 37.11 kg/m   Filed Weights   07/20/19 0956  Weight: 223 lb (101.2 kg)   Body mass index is 37.11 kg/m. Physical Exam Constitutional:      General: She is not in acute distress.    Appearance: She is well-developed.  Genitourinary:     Pelvic exam was performed with patient supine.     Vagina, uterus and rectum normal.     No lesions in the vagina.     No vaginal bleeding.     No cervical motion tenderness, friability, lesion or polyp.     Uterus is mobile.     Uterus is not enlarged.     No uterine mass detected.    Uterus is midaxial.     No right or left adnexal mass present.     Right adnexa not tender.     Left adnexa not tender.  HENT:     Head: Normocephalic and atraumatic. No laceration.     Right Ear: Hearing normal.     Left Ear: Hearing normal.     Mouth/Throat:     Pharynx: Uvula midline.  Eyes:  Pupils: Pupils are equal, round, and reactive to light.  Neck:     Musculoskeletal: Normal range of motion and neck supple.     Thyroid: No thyromegaly.  Cardiovascular:     Rate and Rhythm: Normal rate and regular rhythm.     Heart sounds: No murmur. No friction rub. No gallop.   Pulmonary:     Effort: Pulmonary effort is normal. No respiratory distress.     Breath sounds: Normal breath sounds. No wheezing.  Chest:     Breasts:        Right: No mass, skin change or tenderness.        Left: No mass, skin change or tenderness.  Abdominal:     General: Bowel sounds are normal. There is no distension.     Palpations: Abdomen  is soft.     Tenderness: There is no abdominal tenderness. There is no rebound.  Musculoskeletal: Normal range of motion.  Neurological:     Mental Status: She is alert and oriented to person, place, and time.     Cranial Nerves: No cranial nerve deficit.  Skin:    General: Skin is warm and dry.  Psychiatric:        Judgment: Judgment normal.  Vitals signs reviewed.     Assessment:  ANNUAL EXAM 1. Women's annual routine gynecological examination   2. Screening for cervical cancer   3. Screening for breast cancer   4. Encounter for surveillance of contraceptive pills      Screening Plan:            1.  Cervical Screening-  Pap smear done today  2. Breast screening- Exam annually and mammogram>40 planned   3. Labs managed by PCP  4. Counseling for contraception: oral contraceptives (estrogen/progesterone) - Norgestimate-Ethinyl Estradiol Triphasic 0.18/0.215/0.25 MG-35 MCG tablet; Take 1 tablet by mouth daily.  Dispense: 84 tablet; Refill: 3 Risks of HTN and OCPs discussed. Pt to monitor BPs at home, elsewhere, as usually normal per pt. The risks /benefits of OCPs have been explained to the patient in detail.  Product literature has been given to her.  I have instructed her in the use of OCPs and have given her literature reinforcing this information.  I have explained to the patient that OCPs are not as effective for birth control during the first month of use, and that another form of contraception should be used during this time.  Both first-day start and Sunday start have been explained.  The risks and benefits of each was discussed.  She has been made aware of  the fact that other medications may affect the efficacy of OCPs.  I have answered all of her questions, and I believe that she has an understanding of the effectiveness and use of OCPs.       F/U  Return in about 1 year (around 07/19/2020) for Annual.  Barnett Applebaum, MD, [facility], [facility] 07/20/2019  10:26 AM

## 2019-07-20 NOTE — Patient Instructions (Signed)
PAP every three years Mammogram every year    Call [phone] to schedule at [facility] yearly (with PCP)   

## 2019-07-23 LAB — CYTOLOGY - PAP
Diagnosis: NEGATIVE
HPV: NOT DETECTED

## 2019-08-28 ENCOUNTER — Ambulatory Visit: Payer: Self-pay

## 2019-08-28 ENCOUNTER — Other Ambulatory Visit: Payer: Self-pay

## 2019-08-28 DIAGNOSIS — Z23 Encounter for immunization: Secondary | ICD-10-CM | POA: Diagnosis not present

## 2019-08-28 NOTE — Patient Instructions (Signed)
Influenza (Flu) Vaccine (Inactivated or Recombinant): What You Need to Know 1. Why get vaccinated? Influenza vaccine can prevent influenza (flu). Flu is a contagious disease that spreads around the [place] every year, usually between October and May. Anyone can get the flu, but it is more dangerous for some people. Infants and young children, people 42 years of age and older, pregnant women, and people with certain health conditions or a weakened immune system are at greatest risk of flu complications. Pneumonia, bronchitis, sinus infections and ear infections are examples of flu-related complications. If you have a medical condition, such as heart disease, cancer or diabetes, flu can make it worse. Flu can cause fever and chills, sore throat, muscle aches, fatigue, cough, headache, and runny or stuffy nose. Some people may have vomiting and diarrhea, though this is more common in children than adults. Each year thousands of people in the [place] die from flu, and many more are hospitalized. Flu vaccine prevents millions of illnesses and flu-related visits to the doctor each year. 2. Influenza vaccine CDC recommends everyone 6 months of age and older get vaccinated every flu season. Children 6 months through 8 years of age may need 2 doses during a single flu season. Everyone else needs only 1 dose each flu season. It takes about 2 weeks for protection to develop after vaccination. There are many flu viruses, and they are always changing. Each year a new flu vaccine is made to protect against three or four viruses that are likely to cause disease in the upcoming flu season. Even when the vaccine doesn't exactly match these viruses, it may still provide some protection. Influenza vaccine does not cause flu. Influenza vaccine may be given at the same time as other vaccines. 3. Talk with your health care provider Tell your vaccine provider if the person getting the vaccine:  Has had an  allergic reaction after a previous dose of influenza vaccine, or has any severe, life-threatening allergies.  Has ever had Guillain-Barr Syndrome (also called GBS). In some cases, your health care provider may decide to postpone influenza vaccination to a future visit. People with minor illnesses, such as a cold, may be vaccinated. People who are moderately or severely ill should usually wait until they recover before getting influenza vaccine. Your health care provider can give you more information. 4. Risks of a vaccine reaction  Soreness, redness, and swelling where shot is given, fever, muscle aches, and headache can happen after influenza vaccine.  There may be a very small increased risk of Guillain-Barr Syndrome (GBS) after inactivated influenza vaccine (the flu shot). Young children who get the flu shot along with pneumococcal vaccine (PCV13), and/or DTaP vaccine at the same time might be slightly more likely to have a seizure caused by fever. Tell your health care provider if a child who is getting flu vaccine has ever had a seizure. People sometimes faint after medical procedures, including vaccination. Tell your provider if you feel dizzy or have vision changes or ringing in the ears. As with any medicine, there is a very remote chance of a vaccine causing a severe allergic reaction, other serious injury, or death. 5. What if there is a serious problem? An allergic reaction could occur after the vaccinated person leaves the clinic. If you see signs of a severe allergic reaction (hives, swelling of the face and throat, difficulty breathing, a fast heartbeat, dizziness, or weakness), call 9-1-1 and get the person to the nearest hospital. For other signs that   concern you, call your health care provider. Adverse reactions should be reported to the Vaccine Adverse Event Reporting System (VAERS). Your health care provider will usually file this report, or you can do it yourself. Visit the  VAERS website at www.vaers.hhs.gov or call 1-800-822-7967.VAERS is only for reporting reactions, and VAERS staff do not give medical advice. 6. The National Vaccine Injury Compensation Program The National Vaccine Injury Compensation Program (VICP) is a federal program that was created to compensate people who may have been injured by certain vaccines. Visit the VICP website at www.hrsa.gov/vaccinecompensation or call 1-800-338-2382 to learn about the program and about filing a claim. There is a time limit to file a claim for compensation. 7. How can I learn more?  Ask your healthcare provider.  Call your local or state health department.  Contact the Centers for Disease Control and Prevention (CDC): ? Call 1-800-232-4636 (1-800-CDC-INFO) or ? Visit CDC's www.cdc.gov/flu Vaccine Information Statement (Interim) Inactivated Influenza Vaccine (07/20/2018) This information is not intended to replace advice given to you by your health care provider. Make sure you discuss any questions you have with your health care provider. Document Released: 09/16/2006 Document Revised: 03/13/2019 Document Reviewed: 07/24/2018 Elsevier Patient Education  2020 Elsevier Inc. Preventing Influenza, Adult Influenza, more commonly known as "the flu," is a viral infection that mainly affects the respiratory tract. The respiratory tract includes structures that help you breathe, such as the lungs, nose, and throat. The flu causes many common cold symptoms, as well as a high fever and body aches. The flu spreads easily from person to person (is contagious). The flu is most common from December through March. This is called flu season.You can catch the flu virus by:  Breathing in droplets from an infected person's cough or sneeze.  Touching something that was recently contaminated with the virus and then touching your mouth, nose, or eyes. What can I do to lower my risk?        You can decrease your risk of getting  the flu by:  Getting a flu shot (influenza vaccination) every year. This is the best way to prevent the flu. A flu shot is recommended for everyone age 6 months and older. ? It is best to get a flu shot in the fall, as soon as it is available. Getting a flu shot during winter or spring instead is still a good idea. Flu season can last into early spring. ? Preventing the flu through vaccination requires getting a new flu shot every year. This is because the flu virus changes slightly (mutates) from one year to the next. Even if a flu shot does not completely protect you from all flu virus mutations, it can reduce the severity of your illness and prevent dangerous complications of the flu. ? If you are pregnant, you can and should get a flu shot. ? If you have had a reaction to the shot in the past or if you are allergic to eggs, check with your health care provider before getting a flu shot. ? Sometimes the vaccine is available as a nasal spray. In some years, the nasal spray has not been as effective against the flu virus. Check with your health care provider if you have questions about this.  Practicing good health habits. This is especially important during flu season. ? Avoid contact with people who are sick with flu or cold symptoms. ? Wash your hands with soap and water often. If soap and water are not available, use alcohol-based   hand sanitizer. ? Avoid touching your hands to your face, especially when you have not washed your hands recently. ? Use a disinfectant to clean surfaces at home and at work that may be contaminated with the flu virus. ? Keep your body's disease-fighting system (immune system) in good shape by eating a healthy diet, drinking plenty of fluids, getting enough sleep, and exercising regularly. If you do get the flu, avoid spreading it to others by:  Staying home until your symptoms have been gone for at least one day.  Covering your mouth and nose when you cough or  sneeze.  Avoiding close contact with others, especially babies and elderly people. Why are these changes important? Getting a flu shot and practicing good health habits protects you as well as other people. If you get the flu, your friends, family, and co-workers are also at risk of getting it, because it spreads so easily to others. Each year, about 2 out of every 10 people get the flu. Having the flu can lead to complications, such as pneumonia, ear infection, and sinus infection. The flu also can be deadly, especially for babies, people older than age 42, and people who have serious long-term diseases. How is this treated? Most people recover from the flu by resting at home and drinking plenty of fluids. However, a prescription antiviral medicine may reduce your flu symptoms and may make your flu go away sooner. This medicine must be started within a few days of getting flu symptoms. You can talk with your health care provider about whether you need an antiviral medicine. Antiviral medicine may be prescribed for people who are at risk for more serious flu symptoms. This includes people who:  Are older than age 42.  Are pregnant.  Have a condition that makes the flu worse or more dangerous. Where to find more information  Centers for Disease Control and Prevention: www.cdc.gov/flu/index.htm  Flu.gov: www.flu.gov/prevention-vaccination  American Academy of Family Physicians: familydoctor.org/familydoctor/en/kids/vaccines/preventing-the-flu.html Contact a health care provider if:  You have influenza and you develop new symptoms.  You have: ? Chest pain. ? Diarrhea. ? A fever.  Your cough gets worse, or you produce more mucus. Summary  The best way to prevent the flu is to get a flu shot every year in the fall.  Even if you get the flu after you have received the yearly vaccine, your flu may be milder and go away sooner because of your flu shot.  If you get the flu, antiviral  medicines that are started with a few days of symptoms may reduce your flu symptoms and may make your flu go away sooner.  You can also help prevent the flu by practicing good health habits. This information is not intended to replace advice given to you by your health care provider. Make sure you discuss any questions you have with your health care provider. Document Released: 12/07/2015 Document Revised: 11/04/2017 Document Reviewed: 07/31/2016 Elsevier Patient Education  2020 Elsevier Inc.  

## 2019-08-28 NOTE — Progress Notes (Signed)
     Patient ID: Brooke Jones, female    DOB: Oct 29, 1976, 43 y.o.   MRN: [id number]    Thank you!!  Apolonio Schneiders RN  [facility] Nurse Specialist [place]: [phone]  Cell:  [phone] Website: [email or web address]

## 2019-09-24 DIAGNOSIS — D508 Other iron deficiency anemias: Secondary | ICD-10-CM | POA: Diagnosis not present

## 2019-09-24 DIAGNOSIS — Z0000 Encounter for general adult medical examination without abnormal findings: Secondary | ICD-10-CM | POA: Diagnosis not present

## 2019-09-24 DIAGNOSIS — Z6837 Body mass index (BMI) 37.0-37.9, adult: Secondary | ICD-10-CM | POA: Diagnosis not present

## 2019-09-26 DIAGNOSIS — Z1231 Encounter for screening mammogram for malignant neoplasm of breast: Secondary | ICD-10-CM | POA: Diagnosis not present

## 2019-10-16 ENCOUNTER — Other Ambulatory Visit: Payer: Self-pay | Admitting: Obstetrics & Gynecology

## 2019-11-13 DIAGNOSIS — Z20828 Contact with and (suspected) exposure to other viral communicable diseases: Secondary | ICD-10-CM | POA: Diagnosis not present

## 2019-11-13 DIAGNOSIS — Z01812 Encounter for preprocedural laboratory examination: Secondary | ICD-10-CM | POA: Diagnosis not present

## 2019-11-15 DIAGNOSIS — K635 Polyp of colon: Secondary | ICD-10-CM | POA: Diagnosis not present

## 2019-11-15 DIAGNOSIS — D126 Benign neoplasm of colon, unspecified: Secondary | ICD-10-CM | POA: Diagnosis not present

## 2019-11-15 DIAGNOSIS — I10 Essential (primary) hypertension: Secondary | ICD-10-CM | POA: Diagnosis not present

## 2019-11-15 DIAGNOSIS — K219 Gastro-esophageal reflux disease without esophagitis: Secondary | ICD-10-CM | POA: Diagnosis not present

## 2019-11-15 DIAGNOSIS — E785 Hyperlipidemia, unspecified: Secondary | ICD-10-CM | POA: Diagnosis not present

## 2019-11-15 DIAGNOSIS — D509 Iron deficiency anemia, unspecified: Secondary | ICD-10-CM | POA: Diagnosis not present

## 2019-11-15 DIAGNOSIS — D125 Benign neoplasm of sigmoid colon: Secondary | ICD-10-CM | POA: Diagnosis not present

## 2019-11-15 DIAGNOSIS — K3189 Other diseases of stomach and duodenum: Secondary | ICD-10-CM | POA: Diagnosis not present

## 2019-11-15 DIAGNOSIS — D469 Myelodysplastic syndrome, unspecified: Secondary | ICD-10-CM | POA: Diagnosis not present

## 2019-11-15 DIAGNOSIS — K317 Polyp of stomach and duodenum: Secondary | ICD-10-CM | POA: Diagnosis not present

## 2019-12-10 ENCOUNTER — Ambulatory Visit: Payer: BC Managed Care – PPO | Attending: Internal Medicine

## 2019-12-10 DIAGNOSIS — Z20822 Contact with and (suspected) exposure to covid-19: Secondary | ICD-10-CM

## 2019-12-11 LAB — NOVEL CORONAVIRUS, NAA: SARS-CoV-2, NAA: NOT DETECTED

## 2020-01-15 ENCOUNTER — Other Ambulatory Visit: Payer: Self-pay | Admitting: Obstetrics & Gynecology

## 2020-02-21 ENCOUNTER — Other Ambulatory Visit: Payer: Self-pay

## 2020-02-21 ENCOUNTER — Ambulatory Visit: Payer: Self-pay

## 2020-02-21 VITALS — BP 178/120 | HR 104 | Resp 20

## 2020-02-21 DIAGNOSIS — R002 Palpitations: Secondary | ICD-10-CM | POA: Diagnosis not present

## 2020-02-21 DIAGNOSIS — R519 Headache, unspecified: Secondary | ICD-10-CM | POA: Diagnosis not present

## 2020-02-21 DIAGNOSIS — I10 Essential (primary) hypertension: Secondary | ICD-10-CM | POA: Diagnosis not present

## 2020-02-21 DIAGNOSIS — R079 Chest pain, unspecified: Secondary | ICD-10-CM | POA: Diagnosis not present

## 2020-02-21 DIAGNOSIS — K219 Gastro-esophageal reflux disease without esophagitis: Secondary | ICD-10-CM | POA: Diagnosis not present

## 2020-02-21 DIAGNOSIS — Z0130 Encounter for examination of blood pressure without abnormal findings: Secondary | ICD-10-CM

## 2020-02-21 DIAGNOSIS — E669 Obesity, unspecified: Secondary | ICD-10-CM | POA: Diagnosis not present

## 2020-02-21 DIAGNOSIS — Z6835 Body mass index (BMI) 35.0-35.9, adult: Secondary | ICD-10-CM | POA: Diagnosis not present

## 2020-02-21 DIAGNOSIS — Z87891 Personal history of nicotine dependence: Secondary | ICD-10-CM | POA: Diagnosis not present

## 2020-02-21 NOTE — Patient Instructions (Signed)

## 2020-02-21 NOTE — Progress Notes (Signed)
   Subjective:    Patient ID: Brooke Jones, female    DOB: November 02, 1976, 44 y.o.   MRN: [id number]  Presents to occupational health clinic stating she has not been feeling well the last several days. She describes her symptoms as "feels like my heart is racing". Patient noted to be tachycardiac and hypertensive during assessment. Spouse en route to transport for further treatment at an urgent care. Notified PCP at this time.     Review of Systems  All other systems reviewed and are negative.      Objective:   Physical Exam Constitutional:      Appearance: Normal appearance.  HENT:     Head: Normocephalic.  Cardiovascular:     Rate and Rhythm: Tachycardia present.     Pulses: Normal pulses.  Pulmonary:     Effort: Pulmonary effort is normal.  Skin:    Capillary Refill: Capillary refill takes less than 2 seconds.  Neurological:     Mental Status: She is alert and oriented to person, place, and time. Mental status is at baseline.     Assessment & Plan:   Wt Readings from Last 3 Encounters:  07/20/19 223 lb (101.2 kg)  03/06/18 215 lb (97.5 kg)   Temp Readings from Last 3 Encounters:  No data found for Temp   BP Readings from Last 3 Encounters:  02/21/20 (!) 178/120  07/20/19 (!) 140/98  03/06/18 130/90   Pulse Readings from Last 3 Encounters:  02/21/20 (!) 104  03/06/18 (!) 108             Thank you!!  Danise Edge RN  [place]  [facility] Nurse Specialist [facility] Direct Dial: [phone]  Cell:  [phone] Website: [email or web address]

## 2020-02-22 DIAGNOSIS — D72829 Elevated white blood cell count, unspecified: Secondary | ICD-10-CM | POA: Diagnosis not present

## 2020-02-22 DIAGNOSIS — D508 Other iron deficiency anemias: Secondary | ICD-10-CM | POA: Diagnosis not present

## 2020-02-22 DIAGNOSIS — I10 Essential (primary) hypertension: Secondary | ICD-10-CM | POA: Diagnosis not present

## 2020-02-22 DIAGNOSIS — F419 Anxiety disorder, unspecified: Secondary | ICD-10-CM | POA: Diagnosis not present

## 2020-03-24 DIAGNOSIS — I10 Essential (primary) hypertension: Secondary | ICD-10-CM | POA: Diagnosis not present

## 2020-03-24 DIAGNOSIS — D508 Other iron deficiency anemias: Secondary | ICD-10-CM | POA: Diagnosis not present

## 2020-03-24 DIAGNOSIS — D72829 Elevated white blood cell count, unspecified: Secondary | ICD-10-CM | POA: Diagnosis not present

## 2020-03-24 DIAGNOSIS — F419 Anxiety disorder, unspecified: Secondary | ICD-10-CM | POA: Diagnosis not present

## 2020-04-15 DIAGNOSIS — Z6836 Body mass index (BMI) 36.0-36.9, adult: Secondary | ICD-10-CM | POA: Diagnosis not present

## 2020-04-15 DIAGNOSIS — D72829 Elevated white blood cell count, unspecified: Secondary | ICD-10-CM | POA: Diagnosis not present

## 2020-04-15 DIAGNOSIS — D509 Iron deficiency anemia, unspecified: Secondary | ICD-10-CM | POA: Diagnosis not present

## 2020-05-06 DIAGNOSIS — D509 Iron deficiency anemia, unspecified: Secondary | ICD-10-CM | POA: Diagnosis not present

## 2020-05-06 DIAGNOSIS — D72829 Elevated white blood cell count, unspecified: Secondary | ICD-10-CM | POA: Diagnosis not present

## 2020-07-15 ENCOUNTER — Other Ambulatory Visit: Payer: Self-pay | Admitting: Obstetrics & Gynecology

## 2020-07-15 DIAGNOSIS — Z3041 Encounter for surveillance of contraceptive pills: Secondary | ICD-10-CM

## 2020-08-04 ENCOUNTER — Ambulatory Visit (INDEPENDENT_AMBULATORY_CARE_PROVIDER_SITE_OTHER): Payer: BC Managed Care – PPO | Admitting: Obstetrics & Gynecology

## 2020-08-04 ENCOUNTER — Other Ambulatory Visit: Payer: Self-pay

## 2020-08-04 ENCOUNTER — Encounter: Payer: Self-pay | Admitting: Obstetrics & Gynecology

## 2020-08-04 VITALS — BP 140/90 | Ht 65.0 in | Wt 226.0 lb

## 2020-08-04 DIAGNOSIS — Z3041 Encounter for surveillance of contraceptive pills: Secondary | ICD-10-CM

## 2020-08-04 DIAGNOSIS — Z01419 Encounter for gynecological examination (general) (routine) without abnormal findings: Secondary | ICD-10-CM | POA: Diagnosis not present

## 2020-08-04 MED ORDER — NORGESTIM-ETH ESTRAD TRIPHASIC 0.18/0.215/0.25 MG-35 MCG PO TABS: 1.0000 | ORAL_TABLET | Freq: Every day | ORAL | 3 refills | Status: DC

## 2020-08-04 NOTE — Patient Instructions (Signed)
PAP every three years Mammogram every year    Call [phone] to schedule at [facility] Colonoscopy every 10 years after age 43 Labs yearly (with PCP)  Thank you for choosing [facility]. As part of our ongoing efforts to improve patient experience, we would appreciate your feedback. Please fill out the short survey that you will receive by mail or MyChart. Your opinion is important to us! - Dr. Bentlee Drier   

## 2020-08-04 NOTE — Progress Notes (Signed)
HPI:      [name] is a 44 y.o. G1P1001 who LMP was Patient's last menstrual period was 06/29/2020., she presents today for her annual examination. The patient has no complaints today. The patient is sexually active. Her last pap: approximate date 2020 and was normal and last mammogram: approximate date 2020 and was normal. The patient does perform self breast exams.  There is no notable family history of breast or ovarian cancer in her family.  The patient has regular exercise: yes.  The patient denies current symptoms of depression.    GYN History: Contraception: OCP (estrogen/progesterone)  Occas hot flashes Colon- screen last year due to anemia, due for next at age 18  PMHx: Past Medical History:  Diagnosis Date  . Hypertension    History reviewed. No pertinent surgical history. Family History  Problem Relation Age of Onset  . Heart disease Father   . Hypertension Father   . Esophageal cancer Father   . Breast cancer Neg Hx    Social History   Tobacco Use  . Smoking status: Never Smoker  . Smokeless tobacco: Never Used  Vaping Use  . Vaping Use: Never used  Substance Use Topics  . Alcohol use: Not Currently  . Drug use: Not Currently    Current Outpatient Medications:  .  TRI-SPRINTEC 0.18/0.215/0.25 MG-35 MCG tablet, Take 1 tablet by mouth once daily, Disp: 84 tablet, Rfl: 0 Allergies: Penicillins and Lisinopril  Review of Systems  Constitutional: Negative for chills, fever and malaise/fatigue.  HENT: Negative for congestion, sinus pain and sore throat.   Eyes: Negative for blurred vision and pain.  Respiratory: Negative for cough and wheezing.   Cardiovascular: Negative for chest pain and leg swelling.  Gastrointestinal: Negative for abdominal pain, constipation, diarrhea, heartburn, nausea and vomiting.  Genitourinary: Negative for dysuria, frequency, hematuria and urgency.  Musculoskeletal: Negative for back pain, joint pain, myalgias and neck pain.    Skin: Negative for itching and rash.  Neurological: Negative for dizziness, tremors and weakness.  Endo/Heme/Allergies: Does not bruise/bleed easily.  Psychiatric/Behavioral: Negative for depression. The patient is not nervous/anxious and does not have insomnia.     Objective: BP 140/90   Ht 5\' 5"  (1.651 m)   Wt 226 lb (102.5 kg)   LMP 06/29/2020   BMI 37.61 kg/m   Filed Weights   08/04/20 1532  Weight: 226 lb (102.5 kg)   Body mass index is 37.61 kg/m. Physical Exam Constitutional:      General: She is not in acute distress.    Appearance: She is well-developed.  Genitourinary:     Pelvic exam was performed with patient supine.     Vagina, uterus and rectum normal.     No lesions in the vagina.     No vaginal bleeding.     No cervical motion tenderness, friability, lesion or polyp.     Uterus is mobile.     Uterus is not enlarged.     No uterine mass detected.    Uterus is midaxial.     No right or left adnexal mass present.     Right adnexa not tender.     Left adnexa not tender.  HENT:     Head: Normocephalic and atraumatic. No laceration.     Right Ear: Hearing normal.     Left Ear: Hearing normal.     Mouth/Throat:     Pharynx: Uvula midline.  Eyes:     Pupils: Pupils are equal, round, and  reactive to light.  Neck:     Thyroid: No thyromegaly.  Cardiovascular:     Rate and Rhythm: Normal rate and regular rhythm.     Heart sounds: No murmur heard.  No friction rub. No gallop.   Pulmonary:     Effort: Pulmonary effort is normal. No respiratory distress.     Breath sounds: Normal breath sounds. No wheezing.  Chest:     Breasts:        Right: No mass, skin change or tenderness.        Left: No mass, skin change or tenderness.  Abdominal:     General: Bowel sounds are normal. There is no distension.     Palpations: Abdomen is soft.     Tenderness: There is no abdominal tenderness. There is no rebound.  Musculoskeletal:        General: Normal range of  motion.     Cervical back: Normal range of motion and neck supple.  Neurological:     Mental Status: She is alert and oriented to person, place, and time.     Cranial Nerves: No cranial nerve deficit.  Skin:    General: Skin is warm and dry.  Psychiatric:        Judgment: Judgment normal.  Vitals reviewed.     Assessment:  ANNUAL EXAM 1. Women's annual routine gynecological examination   2. Encounter for surveillance of contraceptive pills      Screening Plan:            1.  Cervical Screening-  Pap smear deferred today  2. Breast screening- Exam annually and mammogram>40 planned   3. Colonoscopy every 10 years, Hemoccult testing - after age 40  4. Labs managed by PCP  5. Counseling for contraception: oral contraceptives (estrogen/progesterone)     F/U  Return in about 1 year (around 08/04/2021) for Annual.  Annamarie Major, MD, [facility], [facility] 08/04/2020  4:11 PM

## 2020-09-23 DIAGNOSIS — Z23 Encounter for immunization: Secondary | ICD-10-CM | POA: Diagnosis not present

## 2020-09-23 DIAGNOSIS — Z0000 Encounter for general adult medical examination without abnormal findings: Secondary | ICD-10-CM | POA: Diagnosis not present

## 2020-09-23 DIAGNOSIS — Z6838 Body mass index (BMI) 38.0-38.9, adult: Secondary | ICD-10-CM | POA: Diagnosis not present

## 2020-09-30 DIAGNOSIS — Z1231 Encounter for screening mammogram for malignant neoplasm of breast: Secondary | ICD-10-CM | POA: Diagnosis not present

## 2020-12-19 ENCOUNTER — Encounter: Payer: Self-pay | Admitting: Obstetrics & Gynecology

## 2020-12-19 ENCOUNTER — Other Ambulatory Visit: Payer: Self-pay

## 2020-12-19 ENCOUNTER — Other Ambulatory Visit: Payer: BC Managed Care – PPO

## 2020-12-19 DIAGNOSIS — Z20822 Contact with and (suspected) exposure to covid-19: Secondary | ICD-10-CM

## 2020-12-22 LAB — NOVEL CORONAVIRUS, NAA: SARS-CoV-2, NAA: NOT DETECTED

## 2021-03-11 DIAGNOSIS — N951 Menopausal and female climacteric states: Secondary | ICD-10-CM | POA: Diagnosis not present

## 2021-03-11 DIAGNOSIS — K219 Gastro-esophageal reflux disease without esophagitis: Secondary | ICD-10-CM | POA: Diagnosis not present

## 2021-03-11 DIAGNOSIS — I10 Essential (primary) hypertension: Secondary | ICD-10-CM | POA: Diagnosis not present

## 2021-03-11 DIAGNOSIS — R040 Epistaxis: Secondary | ICD-10-CM | POA: Diagnosis not present

## 2021-04-09 DIAGNOSIS — J340 Abscess, furuncle and carbuncle of nose: Secondary | ICD-10-CM | POA: Diagnosis not present

## 2021-04-09 DIAGNOSIS — R040 Epistaxis: Secondary | ICD-10-CM | POA: Diagnosis not present

## 2021-05-01 DIAGNOSIS — R040 Epistaxis: Secondary | ICD-10-CM | POA: Diagnosis not present

## 2021-06-16 DIAGNOSIS — Z20822 Contact with and (suspected) exposure to covid-19: Secondary | ICD-10-CM | POA: Diagnosis not present

## 2021-08-06 DIAGNOSIS — Z0000 Encounter for general adult medical examination without abnormal findings: Secondary | ICD-10-CM | POA: Diagnosis not present

## 2021-08-06 DIAGNOSIS — Z23 Encounter for immunization: Secondary | ICD-10-CM | POA: Diagnosis not present

## 2021-08-24 ENCOUNTER — Other Ambulatory Visit: Payer: Self-pay | Admitting: Obstetrics & Gynecology

## 2021-08-24 DIAGNOSIS — Z3041 Encounter for surveillance of contraceptive pills: Secondary | ICD-10-CM

## 2021-08-24 NOTE — Telephone Encounter (Signed)
Patient is scheduled for 07/19/21 with [facility]

## 2021-08-24 NOTE — Telephone Encounter (Signed)
Sch annual

## 2021-09-18 ENCOUNTER — Ambulatory Visit (INDEPENDENT_AMBULATORY_CARE_PROVIDER_SITE_OTHER): Payer: BC Managed Care – PPO | Admitting: Obstetrics & Gynecology

## 2021-09-18 ENCOUNTER — Encounter: Payer: Self-pay | Admitting: Obstetrics & Gynecology

## 2021-09-18 ENCOUNTER — Other Ambulatory Visit: Payer: Self-pay

## 2021-09-18 VITALS — BP 130/80 | Ht 65.0 in | Wt 231.0 lb

## 2021-09-18 DIAGNOSIS — Z01419 Encounter for gynecological examination (general) (routine) without abnormal findings: Secondary | ICD-10-CM | POA: Diagnosis not present

## 2021-09-18 DIAGNOSIS — Z3041 Encounter for surveillance of contraceptive pills: Secondary | ICD-10-CM

## 2021-09-18 DIAGNOSIS — Z1231 Encounter for screening mammogram for malignant neoplasm of breast: Secondary | ICD-10-CM

## 2021-09-18 MED ORDER — NORGESTIM-ETH ESTRAD TRIPHASIC 0.18/0.215/0.25 MG-35 MCG PO TABS: 1.0000 | ORAL_TABLET | Freq: Every day | ORAL | 3 refills | Status: DC

## 2021-09-18 NOTE — Patient Instructions (Signed)
PAP every three years Mammogram every year Colonoscopy every 10 years Labs yearly (with PCP)  Thank you for choosing [facility]. As part of our ongoing efforts to improve patient experience, we would appreciate your feedback. Please fill out the short survey that you will receive by mail or MyChart. Your opinion is important to us! - Dr. Jlen Wintle  Recommendations to boost your immunity to prevent illness such as viral flu and colds, including covid19, are as follows: Vitamin K2 and Vitamin D3. Take Vitamin K2 at 200-300 mcg daily (usually 2-3 pills daily of the over the counter formulation). Take Vitamin D3 at 3000-4000 U daily (usually 3-4 pills daily of the over the counter formulation). Studies show that these two at high normal levels in your system are very effective in keeping your immunity so strong and protective that you will be unlikely to contract viral illness such as those listed above.  Dr Elizah Mierzwa  

## 2021-09-18 NOTE — Progress Notes (Signed)
HPI:      [name] is a 45 y.o. G1P1000 who LMP was Patient's last menstrual period was 09/16/2021., she presents today for her annual examination. The patient has no complaints today. The patient is sexually active. Her last pap: approximate date 2020 and was normal and last mammogram: approximate date 2021 and was normal and this is done a [facility] that comes by work most years . The patient does perform self breast exams.  There is no notable family history of breast or ovarian cancer in her family.  The patient has regular exercise: yes.  The patient denies current symptoms of depression.    GYN History: Contraception: OCP (estrogen/progesterone)  PMHx: Past Medical History:  Diagnosis Date   Hypertension    History reviewed. No pertinent surgical history. Family History  Problem Relation Age of Onset   Heart disease Father    Hypertension Father    Esophageal cancer Father    Breast cancer Neg Hx    Social History   Tobacco Use   Smoking status: Never   Smokeless tobacco: Never  Vaping Use   Vaping Use: Never used  Substance Use Topics   Alcohol use: Not Currently   Drug use: Not Currently    Current Outpatient Medications:    Norgestimate-Ethinyl Estradiol Triphasic (TRI-SPRINTEC) 0.18/0.215/0.25 MG-35 MCG tablet, Take 1 tablet by mouth daily., Disp: 84 tablet, Rfl: 3 Allergies: Penicillins and Lisinopril  Review of Systems  All other systems reviewed and are negative.  Objective: BP 130/80   Ht 5\' 5"  (1.651 m)   Wt 231 lb (104.8 kg)   LMP 09/16/2021   BMI 38.44 kg/m   Filed Weights   09/18/21 0844  Weight: 231 lb (104.8 kg)   Body mass index is 38.44 kg/m. Physical Exam Constitutional:      General: She is not in acute distress.    Appearance: She is well-developed.  Genitourinary:     Bladder, rectum and urethral meatus normal.     No lesions in the vagina.     Right Labia: No rash, tenderness or lesions.    Left Labia: No tenderness, lesions  or rash.    No vaginal bleeding.      Right Adnexa: not tender and no mass present.    Left Adnexa: not tender and no mass present.    No cervical motion tenderness, friability, lesion or polyp.     Uterus is not enlarged.     No uterine mass detected.    Pelvic exam was performed with patient in the lithotomy position.  Breasts:    Right: No mass, skin change or tenderness.     Left: No mass, skin change or tenderness.  HENT:     Head: Normocephalic and atraumatic. No laceration.     Right Ear: Hearing normal.     Left Ear: Hearing normal.     Mouth/Throat:     Pharynx: Uvula midline.  Eyes:     Pupils: Pupils are equal, round, and reactive to light.  Neck:     Thyroid: No thyromegaly.  Cardiovascular:     Rate and Rhythm: Normal rate and regular rhythm.     Heart sounds: No murmur heard.   No friction rub. No gallop.  Pulmonary:     Effort: Pulmonary effort is normal. No respiratory distress.     Breath sounds: Normal breath sounds. No wheezing.  Abdominal:     General: Bowel sounds are normal. There is no distension.  Palpations: Abdomen is soft.     Tenderness: There is no abdominal tenderness. There is no rebound.  Musculoskeletal:        General: Normal range of motion.     Cervical back: Normal range of motion and neck supple.  Neurological:     Mental Status: She is alert and oriented to person, place, and time.     Cranial Nerves: No cranial nerve deficit.  Skin:    General: Skin is warm and dry.  Psychiatric:        Judgment: Judgment normal.  Vitals reviewed.    Assessment:  ANNUAL EXAM 1. Women's annual routine gynecological examination   2. Encounter for surveillance of contraceptive pills   3. Encounter for screening mammogram for malignant neoplasm of breast      Screening Plan:            1.  Cervical Screening-  Pap smear schedule reviewed with patient, Pap smear to be scheduled, 2023  2. Breast screening- Exam annually and mammogram>40  planned (usually [facility] comes by work; if not will call and schedule thru [facility])  3. Colonoscopy every 10 years, Hemoccult testing - after age 3  4. Labs managed by PCP  5. Counseling for contraception: oral contraceptives (estrogen/progesterone)  [facility] - 09/18/21 0846       Pregnancy Intention Screening   Does the patient want to become pregnant in the next year? No    Does the patient's partner want to become pregnant in the next year? No    Would the patient like to discuss contraceptive options today? No      Contraception Wrap Up   Current Method Oral Contraceptive    End Method Oral Contraceptive    Contraception Counseling Provided Yes            The pregnancy intention screening data noted above was reviewed. Potential methods of contraception were discussed. The patient elected to proceed with Oral Contraceptive.   - Norgestimate-Ethinyl Estradiol Triphasic (TRI-SPRINTEC) 0.18/0.215/0.25 MG-35 MCG tablet; Take 1 tablet by mouth daily.  Dispense: 84 tablet; Refill: 3     F/U  Return in about 1 year (around 09/18/2022) for Annual.  Brooke Major, MD, [facility] 09/18/2021  8:59 AM

## 2021-09-29 ENCOUNTER — Other Ambulatory Visit: Payer: Self-pay | Admitting: Obstetrics & Gynecology

## 2021-09-29 DIAGNOSIS — Z139 Encounter for screening, unspecified: Secondary | ICD-10-CM

## 2021-10-02 ENCOUNTER — Other Ambulatory Visit: Payer: Self-pay

## 2021-10-02 ENCOUNTER — Ambulatory Visit
Admission: RE | Admit: 2021-10-02 | Discharge: 2021-10-02 | Disposition: A | Discharge status: Home / Self Care | Payer: BC Managed Care – PPO | Source: Ambulatory Visit | Attending: Obstetrics & Gynecology | Admitting: Obstetrics & Gynecology

## 2021-10-02 DIAGNOSIS — Z139 Encounter for screening, unspecified: Secondary | ICD-10-CM

## 2021-10-02 DIAGNOSIS — Z1231 Encounter for screening mammogram for malignant neoplasm of breast: Secondary | ICD-10-CM | POA: Diagnosis not present

## 2021-11-10 DIAGNOSIS — E559 Vitamin D deficiency, unspecified: Secondary | ICD-10-CM | POA: Diagnosis not present

## 2021-11-10 DIAGNOSIS — R7301 Impaired fasting glucose: Secondary | ICD-10-CM | POA: Diagnosis not present

## 2021-11-10 DIAGNOSIS — D519 Vitamin B12 deficiency anemia, unspecified: Secondary | ICD-10-CM | POA: Diagnosis not present

## 2021-11-10 DIAGNOSIS — E782 Mixed hyperlipidemia: Secondary | ICD-10-CM | POA: Diagnosis not present

## 2021-12-08 ENCOUNTER — Other Ambulatory Visit: Payer: Self-pay

## 2021-12-08 ENCOUNTER — Ambulatory Visit
Admission: EM | Admit: 2021-12-08 | Discharge: 2021-12-08 | Disposition: A | Discharge status: Home / Self Care | Payer: BC Managed Care – PPO | Source: Ambulatory Visit | Attending: Physician Assistant | Admitting: Physician Assistant

## 2021-12-08 DIAGNOSIS — R0981 Nasal congestion: Secondary | ICD-10-CM | POA: Diagnosis not present

## 2021-12-08 DIAGNOSIS — R051 Acute cough: Secondary | ICD-10-CM | POA: Insufficient documentation

## 2021-12-08 DIAGNOSIS — U071 COVID-19: Secondary | ICD-10-CM | POA: Insufficient documentation

## 2021-12-08 DIAGNOSIS — R490 Dysphonia: Secondary | ICD-10-CM | POA: Diagnosis not present

## 2021-12-08 DIAGNOSIS — R509 Fever, unspecified: Secondary | ICD-10-CM | POA: Insufficient documentation

## 2021-12-08 DIAGNOSIS — B349 Viral infection, unspecified: Secondary | ICD-10-CM | POA: Diagnosis not present

## 2021-12-08 HISTORY — DX: Anxiety disorder, unspecified: F41.9

## 2021-12-08 HISTORY — DX: Anemia, unspecified: D64.9

## 2021-12-08 MED ORDER — PROMETHAZINE-DM 6.25-15 MG/5ML PO SYRP: 5.0000 mL | ORAL_SOLUTION | Freq: Every evening | ORAL | 0 refills | Status: AC | PRN

## 2021-12-08 MED ORDER — PREDNISONE 10 MG PO TABS: 20.0000 mg | ORAL_TABLET | Freq: Every day | ORAL | 0 refills | Status: AC

## 2021-12-08 NOTE — ED Provider Notes (Signed)
[facility]    CSN: [id number] Arrival date & time: 12/08/21  1713      History   Chief Complaint Chief Complaint  Patient presents with   Cough    HPI Brooke Jones is a 46 y.o. female presenting for 1 week history of low-grade fever, chills, cough and congestion.  Patient reports symptoms have improved from onset and her congestion is much better.  Reports now she has lingering cough and voice hoarseness.  Patient has tried over-the-counter cough medicine.  She has been taking Coricidin HBP.  Reports that her daughter has been sick with similar symptoms.  Patient denies any breathing difficulty or wheezing, vomiting or diarrhea.  She is otherwise healthy.  No other complaints.  HPI  Past Medical History:  Diagnosis Date   Anemia, unspecified    Anxiety    Hypertension     There are no problems to display for this patient.   History reviewed. No pertinent surgical history.  OB History     Gravida  1   Para  1   Term  1   Preterm  0   AB  0   Living         SAB  0   IAB  0   Ectopic  0   Multiple      Live Births               Home Medications    Prior to Admission medications   Medication Sig Start Date End Date Taking? Authorizing Provider  escitalopram (LEXAPRO) 10 MG tablet Take 10 mg by mouth daily. 10/14/21  Yes [provider]  Ferrous Sulfate (SLOW FE) 142 (45 Fe) MG TBCR 1 tablet   Yes [provider]  gentamicin ointment (GARAMYCIN) 0.1 % Apply topically 2 (two) times daily. 10/26/21  Yes [provider]  metoprolol succinate (TOPROL-XL) 50 MG 24 hr tablet Take by mouth. 02/23/21  Yes [provider]  Multiple Vitamin (DAILY VITAMINS) tablet Take 1 tablet by mouth daily.   Yes [provider]  Norgestimate-Ethinyl Estradiol Triphasic (TRI-SPRINTEC) 0.18/0.215/0.25 MG-35 MCG tablet Take 1 tablet by mouth daily. 09/18/21  Yes Gae Dry, MD  omeprazole (PRILOSEC) 40 MG capsule  Take 40 mg by mouth daily. 10/14/21  Yes [provider]  polyethylene glycol powder (GLYCOLAX/MIRALAX) 17 GM/SCOOP powder Take by mouth.   Yes [provider]  predniSONE (DELTASONE) 10 MG tablet Take 2 tablets (20 mg total) by mouth daily for 5 days. 12/08/21 12/13/21 Yes Danton Clap, PA-C  promethazine-dextromethorphan (PROMETHAZINE-DM) 6.25-15 MG/5ML syrup Take 5 mLs by mouth at bedtime as needed. 12/08/21  Yes Danton Clap, PA-C    Family History Family History  Problem Relation Age of Onset   Heart disease Father    Hypertension Father    Esophageal cancer Father    Breast cancer Neg Hx     Social History Social History   Tobacco Use   Smoking status: Never   Smokeless tobacco: Never  Vaping Use   Vaping Use: Never used  Substance Use Topics   Alcohol use: Not Currently   Drug use: Not Currently     Allergies   Penicillins and Lisinopril   Review of Systems Review of Systems  Constitutional:  Positive for fatigue. Negative for chills, diaphoresis and fever.  HENT:  Positive for congestion, postnasal drip and voice change. Negative for ear pain, rhinorrhea, sinus pressure, sinus pain and sore throat.   Respiratory:  Positive for cough. Negative for shortness of breath.   Gastrointestinal:  Negative for abdominal pain, nausea and vomiting.  Musculoskeletal:  Negative for arthralgias and myalgias.  Skin:  Negative for rash.  Neurological:  Negative for weakness and headaches.  Hematological:  Negative for adenopathy.    Physical Exam Triage Vital Signs ED Triage Vitals  Enc Vitals Group     BP      Pulse      Resp      Temp      Temp src      SpO2      Weight      Height      Head Circumference      Peak Flow      Pain Score      Pain Loc      Pain Edu?      Excl. in [place]?    No data found.  Updated Vital Signs BP (!) 165/96 (BP Location: Right Arm)    Pulse 91    Temp 98.3 F (36.8 C)    Resp 20    Ht 5\' 5"  (1.651 m)    Wt 230 lb  (104.3 kg)    LMP 12/08/2021 (Approximate)    SpO2 99%    BMI 38.27 kg/m      Physical Exam Vitals and nursing note reviewed.  Constitutional:      General: She is not in acute distress.    Appearance: Normal appearance. She is not ill-appearing or toxic-appearing.     Comments: Mild voice hoarseness  HENT:     Head: Normocephalic and atraumatic.     Nose: Congestion present.     Mouth/Throat:     Mouth: Mucous membranes are moist.     Pharynx: Oropharynx is clear.  Eyes:     General: No scleral icterus.       Right eye: No discharge.        Left eye: No discharge.     Conjunctiva/sclera: Conjunctivae normal.  Cardiovascular:     Rate and Rhythm: Normal rate and regular rhythm.     Heart sounds: Normal heart sounds.  Pulmonary:     Effort: Pulmonary effort is normal. No respiratory distress.     Breath sounds: Normal breath sounds.  Musculoskeletal:     Cervical back: Neck supple.  Skin:    General: Skin is dry.  Neurological:     General: No focal deficit present.     Mental Status: She is alert. Mental status is at baseline.     Motor: No weakness.     Gait: Gait normal.  Psychiatric:        Mood and Affect: Mood normal.        Behavior: Behavior normal.        Thought Content: Thought content normal.     UC Treatments / Results  Labs (all labs ordered are listed, but only abnormal results are displayed) Labs Reviewed  COVID-19, FLU A+B AND RSV  SARS CORONAVIRUS 2 (TAT 6-24 HRS)    EKG   Radiology No results found.  Procedures Procedures (including critical care time)  Medications Ordered in UC Medications - No data to display  Initial Impression / Assessment and Plan / UC Course  I have reviewed the triage vital signs and the nursing notes.  Pertinent labs & imaging results that were available during my care of the patient were reviewed by me and considered in my medical decision making (see chart for  details).  46 year old female presenting for  1 week history of cough and congestion.  Reports symptoms have improved from onset but she continues to have lingering cough and voice hoarseness.  Patient is afebrile and overall well-appearing.  Mild voice hoarseness on exam.  Mild nasal congestion.  Chest clear auscultation heart regular rate rhythm.  PCR COVID testing.  Current CDC guidelines, isolation protocol and ED precautions reviewed if COVID-positive.  Advised patient symptoms consistent with viral illness.  Prescribed low-dose prednisone to help with the hoarseness and Promethazine DM to help with cough.  Advised increasing rest and fluids.  Reviewed return and ER precautions.   Final Clinical Impressions(s) / UC Diagnoses   Final diagnoses:  Viral illness  Acute cough  Voice hoarseness     Discharge Instructions      You have received COVID testing today either for positive exposure, concerning symptoms that could be related to COVID infection, screening purposes, or re-testing after confirmed positive.  Your test obtained today checks for active viral infection in the last 1-2 weeks. If your test is negative now, you can still test positive later. So, if you do develop symptoms you should either get re-tested and/or isolate x 5 days and then strict mask use x 5 days (unvaccinated) or mask use x 10 days (vaccinated). Please follow CDC guidelines.  While Rapid antigen tests come back in 15-20 minutes, send out PCR/molecular test results typically come back within 1-3 days. In the mean time, if you are symptomatic, assume this could be a positive test and treat/monitor yourself as if you do have COVID.   We will call with test results if positive. Please download the MyChart app and set up a profile to access test results.   If symptomatic, go home and rest. Push fluids. Take Tylenol as needed for discomfort. Gargle warm salt water. Throat lozenges. Take Mucinex DM or Robitussin for cough. Humidifier in bedroom to ease  coughing. Warm showers. Also review the COVID handout for more information.  COVID-19 INFECTION: The incubation period of COVID-19 is approximately 14 days after exposure, with most symptoms developing in roughly 4-5 days. Symptoms may range in severity from mild to critically severe. Roughly 80% of those infected will have mild symptoms. People of any age may become infected with COVID-19 and have the ability to transmit the virus. The most common symptoms include: fever, fatigue, cough, body aches, headaches, sore throat, nasal congestion, shortness of breath, nausea, vomiting, diarrhea, changes in smell and/or taste.    COURSE OF ILLNESS Some patients may begin with mild disease which can progress quickly into critical symptoms. If your symptoms are worsening please call ahead to the Emergency Department and proceed there for further treatment. Recovery time appears to be roughly 1-2 weeks for mild symptoms and 3-6 weeks for severe disease.   GO IMMEDIATELY TO ER FOR FEVER YOU ARE UNABLE TO GET DOWN WITH TYLENOL, BREATHING PROBLEMS, CHEST PAIN, FATIGUE, LETHARGY, INABILITY TO EAT OR DRINK, ETC  QUARANTINE AND ISOLATION: To help decrease the spread of COVID-19 please remain isolated if you have COVID infection or are highly suspected to have COVID infection. This means -stay home and isolate to one room in the home if you live with others. Do not share a bed or bathroom with others while ill, sanitize and wipe down all countertops and keep common areas clean and disinfected. Stay home for 5 days. If you have no symptoms or your symptoms are resolving after 5 days, you can leave  your house. Continue to wear a mask around others for 5 additional days. If you have been in close contact (within 6 feet) of someone diagnosed with COVID 19, you are advised to quarantine in your home for 14 days as symptoms can develop anywhere from 2-14 days after exposure to the virus. If you develop symptoms, you  must  isolate.  Most current guidelines for COVID after exposure -unvaccinated: isolate 5 days and strict mask use x 5 days. Test on day 5 is possible -vaccinated: wear mask x 10 days if symptoms do not develop -You do not necessarily need to be tested for COVID if you have + exposure and  develop symptoms. Just isolate at home x10 days from symptom onset During this global pandemic, CDC advises to practice social distancing, try to stay at least 33ft away from others at all times. Wear a face covering. Wash and sanitize your hands regularly and avoid going anywhere that is not necessary.  KEEP IN MIND THAT THE COVID TEST IS NOT 100% ACCURATE AND YOU SHOULD STILL DO EVERYTHING TO PREVENT POTENTIAL SPREAD OF VIRUS TO OTHERS (WEAR MASK, WEAR GLOVES, [place] HANDS AND SANITIZE REGULARLY). IF INITIAL TEST IS NEGATIVE, THIS MAY NOT MEAN YOU ARE DEFINITELY NEGATIVE. MOST ACCURATE TESTING IS DONE 5-7 DAYS AFTER EXPOSURE.   It is not advised by CDC to get re-tested after receiving a positive COVID test since you can still test positive for weeks to months after you have already cleared the virus.   *If you have not been vaccinated for COVID, I strongly suggest you consider getting vaccinated as long as there are no contraindications.       ED Prescriptions     Medication Sig Dispense Auth. Provider   predniSONE (DELTASONE) 10 MG tablet Take 2 tablets (20 mg total) by mouth daily for 5 days. 10 tablet Danton Clap, PA-C   promethazine-dextromethorphan (PROMETHAZINE-DM) 6.25-15 MG/5ML syrup Take 5 mLs by mouth at bedtime as needed. 118 mL Danton Clap, PA-C      PDMP not reviewed this encounter.   Danton Clap, PA-C 12/08/21 2012

## 2021-12-08 NOTE — ED Notes (Signed)
Disregard order for Microbiology-COVID-19, Flu A+B and RSV Ordering COVID send out

## 2021-12-08 NOTE — ED Triage Notes (Signed)
Pt reports low grade fever and cold chills last Tuesday, shortly after started with a dry cough, worse at night. Also reports nasal drainage.  denies sore throat and bodyaches.    Daughter had runny nose and cough  Denies home COVID test

## 2021-12-08 NOTE — Discharge Instructions (Signed)

## 2021-12-09 LAB — SARS CORONAVIRUS 2 (TAT 6-24 HRS): SARS Coronavirus 2: POSITIVE — AB

## 2022-06-03 DIAGNOSIS — L2089 Other atopic dermatitis: Secondary | ICD-10-CM | POA: Diagnosis not present

## 2022-09-01 ENCOUNTER — Other Ambulatory Visit: Payer: Self-pay

## 2022-09-01 DIAGNOSIS — Z3041 Encounter for surveillance of contraceptive pills: Secondary | ICD-10-CM

## 2022-09-01 MED ORDER — NORGESTIM-ETH ESTRAD TRIPHASIC 0.18/0.215/0.25 MG-35 MCG PO TABS: 1.0000 | ORAL_TABLET | Freq: Every day | ORAL | 0 refills | Status: AC

## 2022-09-01 NOTE — Telephone Encounter (Signed)
Pt called triage requesting [facility] RF, has annual with [facility] 10/2022. RF sent, pt aware.

## 2022-10-07 ENCOUNTER — Ambulatory Visit: Payer: Self-pay | Admitting: Obstetrics and Gynecology

## 2022-11-08 DIAGNOSIS — J302 Other seasonal allergic rhinitis: Secondary | ICD-10-CM | POA: Diagnosis not present

## 2022-11-08 DIAGNOSIS — Z6835 Body mass index (BMI) 35.0-35.9, adult: Secondary | ICD-10-CM | POA: Diagnosis not present

## 2022-11-08 DIAGNOSIS — I10 Essential (primary) hypertension: Secondary | ICD-10-CM | POA: Diagnosis not present

## 2022-11-23 ENCOUNTER — Ambulatory Visit: Payer: Self-pay | Admitting: Obstetrics and Gynecology

## 2022-11-23 ENCOUNTER — Other Ambulatory Visit: Payer: Self-pay | Admitting: Nurse Practitioner

## 2022-11-23 ENCOUNTER — Other Ambulatory Visit (HOSPITAL_COMMUNITY)
Admission: RE | Admit: 2022-11-23 | Discharge: 2022-11-23 | Disposition: A | Discharge status: Home / Self Care | Payer: BC Managed Care – PPO | Source: Ambulatory Visit | Attending: Nurse Practitioner | Admitting: Nurse Practitioner

## 2022-11-23 DIAGNOSIS — Z01419 Encounter for gynecological examination (general) (routine) without abnormal findings: Secondary | ICD-10-CM | POA: Diagnosis not present

## 2022-11-23 DIAGNOSIS — Z124 Encounter for screening for malignant neoplasm of cervix: Secondary | ICD-10-CM | POA: Diagnosis not present

## 2022-11-27 ENCOUNTER — Other Ambulatory Visit: Payer: Self-pay | Admitting: Obstetrics and Gynecology

## 2022-11-27 DIAGNOSIS — Z3041 Encounter for surveillance of contraceptive pills: Secondary | ICD-10-CM

## 2022-11-30 LAB — CYTOLOGY - PAP
Comment: NEGATIVE
Diagnosis: NEGATIVE
High risk HPV: NEGATIVE

## 2023-01-07 DIAGNOSIS — I10 Essential (primary) hypertension: Secondary | ICD-10-CM | POA: Diagnosis not present

## 2023-01-07 DIAGNOSIS — R7303 Prediabetes: Secondary | ICD-10-CM | POA: Diagnosis not present

## 2023-01-07 DIAGNOSIS — Z0000 Encounter for general adult medical examination without abnormal findings: Secondary | ICD-10-CM | POA: Diagnosis not present

## 2023-01-07 DIAGNOSIS — E782 Mixed hyperlipidemia: Secondary | ICD-10-CM | POA: Diagnosis not present

## 2023-01-07 DIAGNOSIS — D509 Iron deficiency anemia, unspecified: Secondary | ICD-10-CM | POA: Diagnosis not present

## 2023-01-18 DIAGNOSIS — R102 Pelvic and perineal pain: Secondary | ICD-10-CM | POA: Diagnosis not present

## 2023-01-18 DIAGNOSIS — R1084 Generalized abdominal pain: Secondary | ICD-10-CM | POA: Diagnosis not present

## 2023-01-18 DIAGNOSIS — Z3009 Encounter for other general counseling and advice on contraception: Secondary | ICD-10-CM | POA: Diagnosis not present

## 2023-01-18 DIAGNOSIS — Z3202 Encounter for pregnancy test, result negative: Secondary | ICD-10-CM | POA: Diagnosis not present

## 2023-01-18 DIAGNOSIS — N926 Irregular menstruation, unspecified: Secondary | ICD-10-CM | POA: Diagnosis not present

## 2023-01-25 DIAGNOSIS — R102 Pelvic and perineal pain: Secondary | ICD-10-CM | POA: Diagnosis not present

## 2023-02-22 DIAGNOSIS — J340 Abscess, furuncle and carbuncle of nose: Secondary | ICD-10-CM | POA: Diagnosis not present

## 2023-02-22 DIAGNOSIS — J342 Deviated nasal septum: Secondary | ICD-10-CM | POA: Diagnosis not present

## 2023-03-20 ENCOUNTER — Emergency Department
Admission: EM | Admit: 2023-03-20 | Discharge: 2023-03-20 | Disposition: A | Discharge status: Home / Self Care | Payer: BC Managed Care – PPO | Attending: Emergency Medicine | Admitting: Emergency Medicine

## 2023-03-20 ENCOUNTER — Other Ambulatory Visit: Payer: Self-pay

## 2023-03-20 DIAGNOSIS — W268XXA Contact with other sharp object(s), not elsewhere classified, initial encounter: Secondary | ICD-10-CM | POA: Insufficient documentation

## 2023-03-20 DIAGNOSIS — S6992XA Unspecified injury of left wrist, hand and finger(s), initial encounter: Secondary | ICD-10-CM | POA: Diagnosis not present

## 2023-03-20 DIAGNOSIS — Z23 Encounter for immunization: Secondary | ICD-10-CM | POA: Insufficient documentation

## 2023-03-20 DIAGNOSIS — S61215A Laceration without foreign body of left ring finger without damage to nail, initial encounter: Secondary | ICD-10-CM | POA: Insufficient documentation

## 2023-03-20 MED ORDER — TETANUS-DIPHTH-ACELL PERTUSSIS 5-2.5-18.5 LF-MCG/0.5 IM SUSY
0.5000 mL | PREFILLED_SYRINGE | Freq: Once | INTRAMUSCULAR | Status: AC
  Administered 2023-03-20: 0.5 mL via INTRAMUSCULAR
  Filled 2023-03-20: qty 0.5

## 2023-03-20 NOTE — ED Triage Notes (Signed)
Pt repots was opening a can of chili at home and the can cut her left hand ring finger. Bleeding controlled at this time.

## 2023-03-20 NOTE — ED Provider Notes (Signed)
[facility] Provider Note  Patient Contact: 7:08 PM (approximate)   History   Laceration   HPI  Brooke Jones is a 47 y.o. female who presents the emergency department complaining of a laceration to the ring finger of the left hand.  Patient was opening a can of food when she sustained a laceration to the dorsal aspect of the finger.  Bleeding initially was heavy but is controlled on arrival.  Full range of motion is preserved.  Unsure last tetanus shot.  No other injury or complaint currently.     Physical Exam   Triage Vital Signs: ED Triage Vitals  Enc Vitals Group     BP 03/20/23 1838 (!) 155/86     Pulse Rate 03/20/23 1838 (!) 103     Resp 03/20/23 1838 18     Temp 03/20/23 1838 98.8 F (37.1 C)     Temp Source 03/20/23 1838 Oral     SpO2 03/20/23 1838 97 %     Weight 03/20/23 1834 231 lb 7.7 oz (105 kg)     Height 03/20/23 1834 5\' 5"  (1.651 m)     Head Circumference --      Peak Flow --      Pain Score 03/20/23 1834 0     Pain Loc --      Pain Edu? --      Excl. in [place]? --     Most recent vital signs: Vitals:   03/20/23 1838  BP: (!) 155/86  Pulse: (!) 103  Resp: 18  Temp: 98.8 F (37.1 C)  SpO2: 97%     General: Alert and in no acute distress.  Cardiovascular:  Good peripheral perfusion Respiratory: Normal respiratory effort without tachypnea or retractions. Lungs CTAB.  Musculoskeletal: Full range of motion to all extremities.  Visualization of the left ring finger reveals a 1 cm superficial laceration just distal to the PIP joint along the dorsal aspect of the finger.  Full range of motion is preserved.  No active bleeding.  Edges are well-approximated. Neurologic:  No gross focal neurologic deficits are appreciated.  Skin:   No rash noted Other:   ED Results / Procedures / Treatments   Labs (all labs ordered are listed, but only abnormal results are displayed) Labs Reviewed - No data to  display   EKG     RADIOLOGY    No results found.  PROCEDURES:  Critical Care performed: No  ..Laceration Repair  Date/Time: 03/20/2023 7:17 PM  Performed by: Racheal Patches, PA-C Authorized by: Racheal Patches, PA-C   Consent:    Consent obtained:  Verbal   Consent given by:  Patient   Risks discussed:  Pain, poor wound healing, infection and need for additional repair Universal protocol:    Procedure explained and questions answered to patient or proxy's satisfaction: yes     Immediately prior to procedure, a time out was called: yes     Patient identity confirmed:  Verbally with patient Anesthesia:    Anesthesia method:  None Laceration details:    Location:  Finger   Finger location:  L ring finger   Length (cm):  1 Exploration:    Hemostasis achieved with:  Direct pressure   Wound exploration: entire depth of wound visualized     Wound extent: no foreign body, no nerve damage, no tendon damage and no underlying fracture     Contaminated: no   Treatment:    Area cleansed with:  Shur-Clens  Amount of cleaning:  Standard Skin repair:    Repair method:  Tissue adhesive Approximation:    Approximation:  Close Repair type:    Repair type:  Simple Post-procedure details:    Dressing:  Open (no dressing)   Procedure completion:  Tolerated well, no immediate complications    MEDICATIONS ORDERED IN ED: Medications  Tdap (BOOSTRIX) injection 0.5 mL (has no administration in time range)     IMPRESSION / MDM / ASSESSMENT AND PLAN / ED COURSE  I reviewed the triage vital signs and the nursing notes.                                 Differential diagnosis includes, but is not limited to, finger laceration, tendon laceration, retained foreign body  Patient's presentation is most consistent with acute presentation with potential threat to life or bodily function.   Patient's diagnosis is consistent with finger laceration.  Patient presents the  emergency department complaining of a laceration to the ring finger of the left hand.  Patient had a small laceration that was amenable to closure with glue.  Wound care instructions discussed with the patient.  Tetanus shot updated today.  Follow-up primary care as needed..  Patient is given ED precautions to return to the ED for any worsening or new symptoms.     FINAL CLINICAL IMPRESSION(S) / ED DIAGNOSES   Final diagnoses:  Laceration of left ring finger without foreign body without damage to nail, initial encounter     Rx / DC Orders   ED Discharge Orders     None        Note:  This document was prepared using Dragon voice recognition software and may include unintentional dictation errors.   Lanette Hampshire 03/20/23 1917    Jene Every, MD 03/21/23 1501

## 2023-11-25 DIAGNOSIS — Z01419 Encounter for gynecological examination (general) (routine) without abnormal findings: Secondary | ICD-10-CM | POA: Diagnosis not present

## 2024-01-12 ENCOUNTER — Other Ambulatory Visit (HOSPITAL_BASED_OUTPATIENT_CLINIC_OR_DEPARTMENT_OTHER): Payer: Self-pay | Admitting: Internal Medicine

## 2024-01-12 ENCOUNTER — Other Ambulatory Visit: Payer: Self-pay | Admitting: Internal Medicine

## 2024-01-12 DIAGNOSIS — Z1231 Encounter for screening mammogram for malignant neoplasm of breast: Secondary | ICD-10-CM

## 2024-01-12 DIAGNOSIS — I10 Essential (primary) hypertension: Secondary | ICD-10-CM | POA: Diagnosis not present

## 2024-01-12 DIAGNOSIS — Z0000 Encounter for general adult medical examination without abnormal findings: Secondary | ICD-10-CM | POA: Diagnosis not present

## 2024-01-12 DIAGNOSIS — Z136 Encounter for screening for cardiovascular disorders: Secondary | ICD-10-CM

## 2024-01-12 DIAGNOSIS — D509 Iron deficiency anemia, unspecified: Secondary | ICD-10-CM | POA: Diagnosis not present

## 2024-01-12 DIAGNOSIS — E782 Mixed hyperlipidemia: Secondary | ICD-10-CM | POA: Diagnosis not present

## 2024-01-12 DIAGNOSIS — R7303 Prediabetes: Secondary | ICD-10-CM | POA: Diagnosis not present

## 2024-03-06 ENCOUNTER — Ambulatory Visit
Admission: RE | Admit: 2024-03-06 | Discharge: 2024-03-06 | Disposition: A | Discharge status: Home / Self Care | Source: Ambulatory Visit | Attending: Internal Medicine | Admitting: Internal Medicine

## 2024-03-06 DIAGNOSIS — Z1231 Encounter for screening mammogram for malignant neoplasm of breast: Secondary | ICD-10-CM | POA: Diagnosis not present

## 2024-04-06 ENCOUNTER — Ambulatory Visit (HOSPITAL_BASED_OUTPATIENT_CLINIC_OR_DEPARTMENT_OTHER)
Admission: RE | Admit: 2024-04-06 | Discharge: 2024-04-06 | Disposition: A | Discharge status: Home / Self Care | Payer: Self-pay | Source: Ambulatory Visit | Attending: Internal Medicine | Admitting: Internal Medicine

## 2024-04-06 DIAGNOSIS — Z136 Encounter for screening for cardiovascular disorders: Secondary | ICD-10-CM | POA: Insufficient documentation

## 2024-05-21 DIAGNOSIS — K648 Other hemorrhoids: Secondary | ICD-10-CM | POA: Diagnosis not present

## 2024-05-21 DIAGNOSIS — Z09 Encounter for follow-up examination after completed treatment for conditions other than malignant neoplasm: Secondary | ICD-10-CM | POA: Diagnosis not present

## 2024-05-21 DIAGNOSIS — Z860101 Personal history of adenomatous and serrated colon polyps: Secondary | ICD-10-CM | POA: Diagnosis not present

## 2024-06-06 DIAGNOSIS — J340 Abscess, furuncle and carbuncle of nose: Secondary | ICD-10-CM | POA: Diagnosis not present

## 2024-06-22 ENCOUNTER — Encounter: Payer: Self-pay | Admitting: Advanced Practice Midwife
